# Patient Record
Sex: Female | Born: 2000 | Race: Black or African American | Hispanic: No | Marital: Single | Smoking: Former smoker
Health system: Southern US, Community
[De-identification: ages and names within clinical notes are randomized; demographics above are authoritative.]

## PROBLEM LIST (undated history)

## (undated) ENCOUNTER — Inpatient Hospital Stay (HOSPITAL_COMMUNITY): Payer: Self-pay

## (undated) DIAGNOSIS — N760 Acute vaginitis: Secondary | ICD-10-CM

## (undated) DIAGNOSIS — B9689 Other specified bacterial agents as the cause of diseases classified elsewhere: Secondary | ICD-10-CM

## (undated) DIAGNOSIS — L709 Acne, unspecified: Secondary | ICD-10-CM

## (undated) DIAGNOSIS — E559 Vitamin D deficiency, unspecified: Secondary | ICD-10-CM

## (undated) DIAGNOSIS — F419 Anxiety disorder, unspecified: Secondary | ICD-10-CM

## (undated) DIAGNOSIS — J45909 Unspecified asthma, uncomplicated: Secondary | ICD-10-CM

## (undated) HISTORY — DX: Other specified bacterial agents as the cause of diseases classified elsewhere: N76.0

## (undated) HISTORY — DX: Acne, unspecified: L70.9

## (undated) HISTORY — DX: Vitamin D deficiency, unspecified: E55.9

## (undated) HISTORY — PX: NO PAST SURGERIES: SHX2092

## (undated) HISTORY — PX: WISDOM TOOTH EXTRACTION: SHX21

## (undated) HISTORY — DX: Other specified bacterial agents as the cause of diseases classified elsewhere: B96.89

## (undated) HISTORY — DX: Anxiety disorder, unspecified: F41.9

---
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
## 2019-06-10 ENCOUNTER — Other Ambulatory Visit: Payer: Self-pay | Admitting: Family Medicine

## 2019-06-11 ENCOUNTER — Telehealth: Payer: Self-pay | Admitting: *Deleted

## 2019-06-11 NOTE — Telephone Encounter (Signed)
Benzacline not covered by Medicaid.  Please see below of formulary.  Let "RN Team" know if you are changing to covered medications or would like to pursue a PA. Jessica Fleeger, CMA    

## 2019-06-16 ENCOUNTER — Other Ambulatory Visit: Payer: Self-pay | Admitting: Family Medicine

## 2019-06-16 MED ORDER — CLINDAMYCIN PHOS-BENZOYL PEROX 1.2-5 % EX GEL: 1.0000 | Freq: Two times a day (BID) | CUTANEOUS | 0 refills | Status: DC

## 2019-06-16 NOTE — Progress Notes (Signed)
Changed benzaclin to duac per medicaid formulary  Amanda Dawn MD PGY-3 Family Medicine Resident

## 2019-06-16 NOTE — Telephone Encounter (Signed)
Changed to duac from benzaclin per medicaid formulary  Guadalupe Dawn MD PGY-3 Family Medicine Resident
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
## 2019-12-08 ENCOUNTER — Other Ambulatory Visit: Payer: Self-pay | Admitting: *Deleted

## 2019-12-09 MED ORDER — CLINDAMYCIN PHOS-BENZOYL PEROX 1.2-5 % EX GEL: 1.0000 | Freq: Two times a day (BID) | CUTANEOUS | 0 refills | Status: DC

## 2019-12-10 ENCOUNTER — Other Ambulatory Visit: Payer: Self-pay | Admitting: Family Medicine

## 2019-12-30 ENCOUNTER — Encounter (HOSPITAL_COMMUNITY): Payer: Self-pay | Admitting: Emergency Medicine

## 2019-12-30 ENCOUNTER — Emergency Department (HOSPITAL_COMMUNITY)
Admission: EM | Admit: 2019-12-30 | Discharge: 2019-12-30 | Disposition: A | Discharge status: Home / Self Care | Payer: Medicaid Other | Attending: Emergency Medicine | Admitting: Emergency Medicine

## 2019-12-30 ENCOUNTER — Other Ambulatory Visit: Payer: Self-pay

## 2019-12-30 DIAGNOSIS — J45909 Unspecified asthma, uncomplicated: Secondary | ICD-10-CM | POA: Insufficient documentation

## 2019-12-30 DIAGNOSIS — R110 Nausea: Secondary | ICD-10-CM | POA: Insufficient documentation

## 2019-12-30 DIAGNOSIS — R1012 Left upper quadrant pain: Secondary | ICD-10-CM | POA: Insufficient documentation

## 2019-12-30 LAB — CBC
HCT: 34.4 % — ABNORMAL LOW (ref 36.0–46.0)
Hemoglobin: 11.9 g/dL — ABNORMAL LOW (ref 12.0–15.0)
MCH: 27.2 pg (ref 26.0–34.0)
MCHC: 34.6 g/dL (ref 30.0–36.0)
MCV: 78.5 fL — ABNORMAL LOW (ref 80.0–100.0)
Platelets: 329 10*3/uL (ref 150–400)
RBC: 4.38 MIL/uL (ref 3.87–5.11)
RDW: 14.6 % (ref 11.5–15.5)
WBC: 10.7 10*3/uL — ABNORMAL HIGH (ref 4.0–10.5)
nRBC: 0 % (ref 0.0–0.2)

## 2019-12-30 LAB — COMPREHENSIVE METABOLIC PANEL
ALT: 10 U/L (ref 0–44)
AST: 16 U/L (ref 15–41)
Albumin: 3.9 g/dL (ref 3.5–5.0)
Alkaline Phosphatase: 59 U/L (ref 38–126)
Anion gap: 9 (ref 5–15)
BUN: 11 mg/dL (ref 6–20)
CO2: 27 mmol/L (ref 22–32)
Calcium: 9 mg/dL (ref 8.9–10.3)
Chloride: 105 mmol/L (ref 98–111)
Creatinine, Ser: 0.78 mg/dL (ref 0.44–1.00)
GFR calc Af Amer: >60 mL/min (ref >60)
GFR calc non Af Amer: >60 mL/min (ref >60)
Glucose, Bld: 77 mg/dL (ref 70–99)
Potassium: 3.3 mmol/L — ABNORMAL LOW (ref 3.5–5.1)
Sodium: 141 mmol/L (ref 135–145)
Total Bilirubin: 0.5 mg/dL (ref 0.3–1.2)
Total Protein: 7.5 g/dL (ref 6.5–8.1)

## 2019-12-30 LAB — I-STAT BETA HCG BLOOD, ED (MC, WL, AP ONLY): I-stat hCG, quantitative: <5 m[IU]/mL (ref <5)

## 2019-12-30 LAB — LIPASE, BLOOD: Lipase: 22 U/L (ref 11–51)

## 2019-12-30 MED ORDER — SUCRALFATE 1 G PO TABS
1.0000 g | ORAL_TABLET | Freq: Four times a day (QID) | ORAL | 0 refills | Status: DC | PRN
Start: 2019-12-30 — End: 2020-05-12

## 2019-12-30 MED ORDER — ALUM & MAG HYDROXIDE-SIMETH 200-200-20 MG/5ML PO SUSP
30.0000 mL | Freq: Once | ORAL | Status: AC
  Administered 2019-12-30: 30 mL via ORAL
  Filled 2019-12-30: qty 30

## 2019-12-30 MED ORDER — LIDOCAINE VISCOUS HCL 2 % MT SOLN
15.0000 mL | Freq: Once | OROMUCOSAL | Status: AC
  Administered 2019-12-30: 15:00:00 15 mL via ORAL
  Filled 2019-12-30: qty 15

## 2019-12-30 MED ORDER — SODIUM CHLORIDE 0.9% FLUSH
3.0000 mL | Freq: Once | INTRAVENOUS | Status: AC
  Administered 2019-12-30: 3 mL via INTRAVENOUS

## 2019-12-30 MED ORDER — FAMOTIDINE IN NACL 20-0.9 MG/50ML-% IV SOLN
20.0000 mg | Freq: Once | INTRAVENOUS | Status: AC
  Administered 2019-12-30: 20 mg via INTRAVENOUS
  Filled 2019-12-30: qty 50

## 2019-12-30 NOTE — ED Provider Notes (Signed)
[facility] Emergency Department Provider Note MRN:  [id number]  Arrival date & time: 12/30/19     Chief Complaint   Abdominal Pain, Nausea, and Emesis   History of Present Illness   Amanda Vincent is a 19 y.o. year-old female with a history of asthma presenting to the ED with chief complaint of abdominal pain.  Location: Left upper quadrant Duration: 1 or 2 hours Onset: Sudden about 1 hour after eating lunch Timing: Constant Description: Squeezing burning pain Severity: Mild to moderate Exacerbating/Alleviating Factors: None Associated Symptoms: Nausea Pertinent Negatives: No vomiting, no diarrhea, no chest pain, no shortness of breath, no lower abdominal pain, no dysuria, no vaginal bleeding or discharge   Review of Systems  A complete 10 system review of systems was obtained and all systems are negative except as noted in the HPI and PMH.   Patient's Health History    Past Medical History:  Diagnosis Date  . Asthma     History reviewed. No pertinent surgical history.  No family history on file.  Social History   Socioeconomic History  . Marital status: Single    Spouse name: Not on file  . Number of children: Not on file  . Years of education: Not on file  . Highest education level: Not on file  Occupational History  . Not on file  Tobacco Use  . Smoking status: Never Smoker  . Smokeless tobacco: Never Used  Substance and Sexual Activity  . Alcohol use: No  . Drug use: Not on file  . Sexual activity: Never  Other Topics Concern  . Not on file  Social History Narrative  . Not on file   Social Determinants of Health   Financial Resource Strain:   . Difficulty of Paying Living Expenses:   Food Insecurity:   . Worried About Charity fundraiser in the Last Year:   . Arboriculturist in the Last Year:   Transportation Needs:   . Film/video editor (Medical):   Marland Kitchen Lack of Transportation (Non-Medical):   Physical Activity:     . Days of Exercise per Week:   . Minutes of Exercise per Session:   Stress:   . Feeling of Stress :   Social Connections:   . Frequency of Communication with Friends and Family:   . Frequency of Social Gatherings with Friends and Family:   . Attends Religious Services:   . Active Member of Clubs or Organizations:   . Attends Archivist Meetings:   Marland Kitchen Marital Status:   Intimate Partner Violence:   . Fear of Current or Ex-Partner:   . Emotionally Abused:   Marland Kitchen Physically Abused:   . Sexually Abused:      Physical Exam   Vitals:   12/30/19 1340  BP: 117/78  Pulse: 74  Resp: 16  Temp: 98.8 F (37.1 C)  SpO2: 99%    CONSTITUTIONAL: Well-appearing, NAD NEURO:  Alert and oriented x 3, no focal deficits EYES:  eyes equal and reactive ENT/NECK:  no LAD, no JVD CARDIO: Regular rate, well-perfused, normal S1 and S2 PULM:  CTAB no wheezing or rhonchi GI/GU:  normal bowel sounds, non-distended, non-tender MSK/SPINE:  No gross deformities, no edema SKIN:  no rash, atraumatic PSYCH:  Appropriate speech and behavior  *Additional and/or pertinent findings included in MDM below  Diagnostic and Interventional Summary    EKG Interpretation  Date/Time:    Ventricular Rate:    PR Interval:    QRS  Duration:   QT Interval:    QTC Calculation:   R Axis:     Text Interpretation:        Labs Reviewed  COMPREHENSIVE METABOLIC PANEL - Abnormal; Notable for the following components:      Result Value   Potassium 3.3 (*)    All other components within normal limits  CBC - Abnormal; Notable for the following components:   WBC 10.7 (*)    Hemoglobin 11.9 (*)    HCT 34.4 (*)    MCV 78.5 (*)    All other components within normal limits  LIPASE, BLOOD  URINALYSIS, ROUTINE W REFLEX MICROSCOPIC  I-STAT BETA HCG BLOOD, ED ([facility], [facility], [facility] ONLY)    No orders to display    Medications  sodium chloride flush (NS) 0.9 % injection 3 mL (3 mLs Intravenous Given 12/30/19 1525)   famotidine (PEPCID) IVPB 20 mg premix (0 mg Intravenous Stopped 12/30/19 1630)  alum & mag hydroxide-simeth (MAALOX/MYLANTA) 200-200-20 MG/5ML suspension 30 mL (30 mLs Oral Given 12/30/19 1525)    And  lidocaine (XYLOCAINE) 2 % viscous mouth solution 15 mL (15 mLs Oral Given 12/30/19 1525)     Procedures  /  Critical Care Procedures  ED Course and Medical Decision Making  I have reviewed the triage vital signs, the nursing notes, and pertinent available records from the EMR.  Listed above are laboratory and imaging tests that I personally ordered, reviewed, and interpreted and then considered in my medical decision making (see below for details).      Suspect gastritis or acid reflux as the cause of patient's pain.  Normal vital signs, very benign abdomen, will provide GI cocktail, Pepcid, reassess.  Will evaluate labs, namely lipase.  No Murphy sign, no lower abdominal tenderness.  Patient feeling much better after medications listed above, labs reassuring, appropriate for discharge.  Elmer Sow. Pilar Plate, MD [facility] Emergency Medicine [facility] [email or web address]  Final Clinical Impressions(s) / ED Diagnoses     ICD-10-CM   1. Left upper quadrant abdominal pain  R10.12     ED Discharge Orders         Ordered    sucralfate (CARAFATE) 1 g tablet  4 times daily PRN     12/30/19 1658           Discharge Instructions Discussed with and Provided to Patient:     Discharge Instructions     You were evaluated in the Emergency Department and after careful evaluation, we did not find any emergent condition requiring admission or further testing in the hospital.  Your exam/testing today was overall reassuring.  Your symptoms seem to be due to inflammation of the lining of the stomach.  Please take the Carafate medication provided as needed for discomfort.  Please return to the Emergency Department if you experience any worsening of your condition.  We encourage  you to follow up with a primary care provider.  Thank you for allowing [place] to be a part of your care.        Sabas Sous, MD 12/30/19 1659

## 2019-12-30 NOTE — Discharge Instructions (Addendum)
You were evaluated in the Emergency Department and after careful evaluation, we did not find any emergent condition requiring admission or further testing in the hospital.  Your exam/testing today was overall reassuring.  Your symptoms seem to be due to inflammation of the lining of the stomach.  Please take the Carafate medication provided as needed for discomfort.  Please return to the Emergency Department if you experience any worsening of your condition.  We encourage you to follow up with a primary care provider.  Thank you for allowing [place] to be a part of your care.

## 2019-12-30 NOTE — ED Triage Notes (Signed)
Patient here from home with complaints of sudden onset of left upper abd pain and nausea that started 1 hour ago while eating lunch.

## 2019-12-30 NOTE — ED Notes (Signed)
An After Visit Summary was printed and given to the patient. Discharge instructions given and no further questions at this time.  

## 2020-01-15 ENCOUNTER — Encounter (HOSPITAL_COMMUNITY): Payer: Self-pay | Admitting: Emergency Medicine

## 2020-01-15 ENCOUNTER — Ambulatory Visit (HOSPITAL_COMMUNITY)
Admission: EM | Admit: 2020-01-15 | Discharge: 2020-01-15 | Disposition: A | Discharge status: Home / Self Care | Payer: Medicaid Other | Attending: Family Medicine | Admitting: Family Medicine

## 2020-01-15 ENCOUNTER — Other Ambulatory Visit: Payer: Self-pay

## 2020-01-15 DIAGNOSIS — N912 Amenorrhea, unspecified: Secondary | ICD-10-CM

## 2020-01-15 DIAGNOSIS — Z3202 Encounter for pregnancy test, result negative: Secondary | ICD-10-CM | POA: Diagnosis not present

## 2020-01-15 LAB — POC URINE PREG, ED: Preg Test, Ur: NEGATIVE

## 2020-01-15 MED ORDER — MELOXICAM 7.5 MG PO TABS: 7.5000 mg | ORAL_TABLET | Freq: Every day | ORAL | 0 refills | Status: DC

## 2020-01-15 NOTE — ED Triage Notes (Signed)
Menstrual is 3 days late, breast tenderness.

## 2020-01-15 NOTE — ED Provider Notes (Signed)
[place]   MRN: [id number] DOB: 2001/03/04  Subjective:   Amanda Vincent is a 19 y.o. female presenting for 3-day history of mild breast tenderness, intermittent fatigue and amenorrhea. LMP 12/17/2019.  Patient is worried about pregnancy given that she is very regular on her cycle.  Denies concern for STI in prefers to hold off on any testing for this for now.  No current facility-administered medications for this encounter.  Current Outpatient Medications:  .  sucralfate (CARAFATE) 1 g tablet, Take 1 tablet (1 g total) by mouth 4 (four) times daily as needed., Disp: 30 tablet, Rfl: 0 .  Clindamycin-Benzoyl Per, Refr, gel, APPLY 1 EACH TOPICALLY 2 (TWO) TIMES DAILY., Disp: 45 g, Rfl: 0 .  ibuprofen (ADVIL,MOTRIN) 600 MG tablet, Take 1 tablet (600 mg total) by mouth every 6 (six) hours as needed., Disp: 30 tablet, Rfl: 0   Allergies  Allergen Reactions  . Corn-Containing Products   . Dog Epithelium   . Mold Extract [Trichophyton]   . Peanut-Containing Drug Products   . Sesame Seed (Diagnostic)     Past Medical History:  Diagnosis Date  . Asthma      History reviewed. No pertinent surgical history.  No family history on file.  Social History   Tobacco Use  . Smoking status: Never Smoker  . Smokeless tobacco: Never Used  Substance Use Topics  . Alcohol use: No  . Drug use: Not on file    ROS   Objective:   Vitals: BP 118/76   Pulse 79   Temp 98.5 F (36.9 C) (Oral)   Resp 16   LMP 12/17/2019   SpO2 98%   Physical Exam Constitutional:      General: She is not in acute distress.    Appearance: Normal appearance. She is well-developed. She is not ill-appearing, toxic-appearing or diaphoretic.  HENT:     Head: Normocephalic and atraumatic.     Nose: Nose normal.     Mouth/Throat:     Mouth: Mucous membranes are moist.  Eyes:     Extraocular Movements: Extraocular movements intact.     Pupils: Pupils are equal, round, and reactive to  light.  Cardiovascular:     Rate and Rhythm: Normal rate and regular rhythm.     Pulses: Normal pulses.     Heart sounds: Normal heart sounds. No murmur. No friction rub. No gallop.   Pulmonary:     Effort: Pulmonary effort is normal. No respiratory distress.     Breath sounds: Normal breath sounds. No stridor. No wheezing, rhonchi or rales.  Skin:    General: Skin is warm and dry.     Findings: No rash.  Neurological:     Mental Status: She is alert and oriented to person, place, and time.  Psychiatric:        Mood and Affect: Mood normal.        Behavior: Behavior normal.        Thought Content: Thought content normal.        Judgment: Judgment normal.     Results for orders placed or performed during the hospital encounter of 01/15/20 (from the past 24 hour(s))  POC urine pregnancy     Status: None   Collection Time: 01/15/20  5:04 PM  Result Value Ref Range   Preg Test, Ur NEGATIVE NEGATIVE    Assessment and Plan :   PDMP not reviewed this encounter.  1. Amenorrhea   2. Negative pregnancy test  Counseled on amenorrhea, recommended supportive care. Follow up with gynecologist if sx persist. Counseled patient on potential for adverse effects with medications prescribed/recommended today, ER and return-to-clinic precautions discussed, patient verbalized understanding.    Wallis Bamberg, [place] 01/15/20 1707

## 2020-02-05 ENCOUNTER — Other Ambulatory Visit: Payer: Self-pay | Admitting: Family Medicine

## 2020-03-16 ENCOUNTER — Other Ambulatory Visit: Payer: Self-pay | Admitting: Family Medicine

## 2020-03-30 DIAGNOSIS — Z3009 Encounter for other general counseling and advice on contraception: Secondary | ICD-10-CM | POA: Diagnosis not present

## 2020-03-30 DIAGNOSIS — Z3200 Encounter for pregnancy test, result unknown: Secondary | ICD-10-CM | POA: Diagnosis not present

## 2020-04-12 ENCOUNTER — Emergency Department (HOSPITAL_COMMUNITY)
Admission: EM | Admit: 2020-04-12 | Discharge: 2020-04-13 | Disposition: A | Discharge status: Home / Self Care | Payer: Medicaid Other | Attending: Emergency Medicine | Admitting: Emergency Medicine

## 2020-04-12 ENCOUNTER — Encounter (HOSPITAL_COMMUNITY): Payer: Self-pay | Admitting: Emergency Medicine

## 2020-04-12 ENCOUNTER — Other Ambulatory Visit: Payer: Self-pay | Admitting: Student in an Organized Health Care Education/Training Program

## 2020-04-12 ENCOUNTER — Other Ambulatory Visit: Payer: Self-pay

## 2020-04-12 DIAGNOSIS — O208 Other hemorrhage in early pregnancy: Secondary | ICD-10-CM | POA: Diagnosis present

## 2020-04-12 DIAGNOSIS — Z5321 Procedure and treatment not carried out due to patient leaving prior to being seen by health care provider: Secondary | ICD-10-CM | POA: Diagnosis not present

## 2020-04-12 DIAGNOSIS — Z3A12 12 weeks gestation of pregnancy: Secondary | ICD-10-CM | POA: Diagnosis not present

## 2020-04-12 NOTE — ED Triage Notes (Addendum)
Pt reports positive pregnancy test with EDD 10/21/20. Reports passing a large brown clot this morning. Here wanting confirmation of viable pregnacy. VSS. NAD at present.

## 2020-04-13 NOTE — ED Notes (Signed)
Pt did not respond when called for vitals check and is not seen in or outside the lobby

## 2020-04-21 ENCOUNTER — Encounter: Payer: Self-pay | Admitting: General Practice

## 2020-04-29 ENCOUNTER — Other Ambulatory Visit: Payer: Self-pay

## 2020-04-29 DIAGNOSIS — Z3401 Encounter for supervision of normal first pregnancy, first trimester: Secondary | ICD-10-CM | POA: Diagnosis not present

## 2020-04-29 DIAGNOSIS — J45909 Unspecified asthma, uncomplicated: Secondary | ICD-10-CM | POA: Diagnosis not present

## 2020-04-29 DIAGNOSIS — F419 Anxiety disorder, unspecified: Secondary | ICD-10-CM | POA: Diagnosis not present

## 2020-04-29 LAB — CYSTIC FIBROSIS DIAGNOSTIC STUDY: Interpretation-CFDNA:: NEGATIVE

## 2020-04-29 LAB — SICKLE CELL SCREEN

## 2020-05-10 ENCOUNTER — Encounter: Payer: Self-pay | Admitting: *Deleted

## 2020-05-12 ENCOUNTER — Other Ambulatory Visit (HOSPITAL_COMMUNITY): Payer: Self-pay | Admitting: Nurse Practitioner

## 2020-05-12 ENCOUNTER — Ambulatory Visit: Payer: Medicaid Other

## 2020-05-12 ENCOUNTER — Ambulatory Visit: Payer: Medicaid Other | Admitting: *Deleted

## 2020-05-12 ENCOUNTER — Encounter: Payer: Self-pay | Admitting: *Deleted

## 2020-05-12 ENCOUNTER — Other Ambulatory Visit: Payer: Self-pay

## 2020-05-12 ENCOUNTER — Ambulatory Visit: Payer: Medicaid Other | Attending: Obstetrics and Gynecology

## 2020-05-12 VITALS — BP 125/88 | HR 82 | Wt 144.6 lb

## 2020-05-12 DIAGNOSIS — Z369 Encounter for antenatal screening, unspecified: Secondary | ICD-10-CM

## 2020-05-12 DIAGNOSIS — Z3682 Encounter for antenatal screening for nuchal translucency: Secondary | ICD-10-CM

## 2020-05-14 ENCOUNTER — Telehealth: Payer: Self-pay | Admitting: Obstetrics and Gynecology

## 2020-05-14 LAB — FIRST TRIMESTER SCREEN W/NT
CRL: 75.8 mm
DIA MoM: 2.67
DIA Value: 540.6 pg/mL
Gest Age-Collect: 13.4 weeks
Maternal Age At EDD: 21 yr
Nuchal Translucency MoM: 0.8
Nuchal Translucency: 1.3 mm
Number of Fetuses: 1
PAPP-A MoM: 2.99
PAPP-A Value: 4342.8 ng/mL
Test Results:: NEGATIVE
Weight: 145 [lb_av]
hCG MoM: 1.68
hCG Value: 136.6 IU/mL

## 2020-05-14 NOTE — Telephone Encounter (Signed)
I called [name] to review that she has negative first trimester screen results that decreased the risk for her pregnancy to be affected by Down syndrome from her 1 in 12,029 age-related risk to 1 in 6,700. The risk for the pregnancy to be affected by trisomy 18 also decreased from her 1 in 2,014 age-related risk to 1 in <10,000. [name] was reminded that while this screen significantly reduced the likelihood of the pregnancy being affected by trisomy 19 or trisomy 73, it cannot be considered diagnostic.   We also reviewed that AFP screening for open neural tube defects is available in the second trimester if desired.  Cherly Anderson, MS, CGC

## 2020-05-27 ENCOUNTER — Other Ambulatory Visit: Payer: Self-pay | Admitting: Family

## 2020-05-27 DIAGNOSIS — Z789 Other specified health status: Secondary | ICD-10-CM | POA: Diagnosis not present

## 2020-05-27 DIAGNOSIS — O99330 Smoking (tobacco) complicating pregnancy, unspecified trimester: Secondary | ICD-10-CM | POA: Diagnosis not present

## 2020-05-27 DIAGNOSIS — N83202 Unspecified ovarian cyst, left side: Secondary | ICD-10-CM | POA: Diagnosis not present

## 2020-05-27 DIAGNOSIS — Z3A15 15 weeks gestation of pregnancy: Secondary | ICD-10-CM | POA: Diagnosis not present

## 2020-05-27 DIAGNOSIS — Z362 Encounter for other antenatal screening follow-up: Secondary | ICD-10-CM

## 2020-05-27 DIAGNOSIS — O281 Abnormal biochemical finding on antenatal screening of mother: Secondary | ICD-10-CM | POA: Diagnosis not present

## 2020-05-27 DIAGNOSIS — J45909 Unspecified asthma, uncomplicated: Secondary | ICD-10-CM | POA: Diagnosis not present

## 2020-05-27 DIAGNOSIS — F419 Anxiety disorder, unspecified: Secondary | ICD-10-CM | POA: Diagnosis not present

## 2020-05-27 DIAGNOSIS — O99320 Drug use complicating pregnancy, unspecified trimester: Secondary | ICD-10-CM | POA: Diagnosis not present

## 2020-05-27 DIAGNOSIS — R824 Acetonuria: Secondary | ICD-10-CM | POA: Diagnosis not present

## 2020-05-27 DIAGNOSIS — Z3402 Encounter for supervision of normal first pregnancy, second trimester: Secondary | ICD-10-CM | POA: Diagnosis not present

## 2020-05-27 DIAGNOSIS — D582 Other hemoglobinopathies: Secondary | ICD-10-CM | POA: Diagnosis not present

## 2020-06-07 ENCOUNTER — Other Ambulatory Visit: Payer: Self-pay | Admitting: Obstetrics & Gynecology

## 2020-06-17 ENCOUNTER — Inpatient Hospital Stay (HOSPITAL_COMMUNITY)
Admission: AD | Admit: 2020-06-17 | Discharge: 2020-06-17 | Disposition: A | Discharge status: Home / Self Care | Payer: Medicaid Other | Attending: Obstetrics & Gynecology | Admitting: Obstetrics & Gynecology

## 2020-06-17 ENCOUNTER — Other Ambulatory Visit: Payer: Self-pay

## 2020-06-17 ENCOUNTER — Inpatient Hospital Stay (HOSPITAL_BASED_OUTPATIENT_CLINIC_OR_DEPARTMENT_OTHER): Payer: Medicaid Other

## 2020-06-17 ENCOUNTER — Encounter (HOSPITAL_COMMUNITY): Payer: Self-pay | Admitting: Obstetrics & Gynecology

## 2020-06-17 DIAGNOSIS — O4102X0 Oligohydramnios, second trimester, not applicable or unspecified: Secondary | ICD-10-CM

## 2020-06-17 DIAGNOSIS — O99512 Diseases of the respiratory system complicating pregnancy, second trimester: Secondary | ICD-10-CM | POA: Insufficient documentation

## 2020-06-17 DIAGNOSIS — O42912 Preterm premature rupture of membranes, unspecified as to length of time between rupture and onset of labor, second trimester: Secondary | ICD-10-CM | POA: Insufficient documentation

## 2020-06-17 DIAGNOSIS — O42919 Preterm premature rupture of membranes, unspecified as to length of time between rupture and onset of labor, unspecified trimester: Secondary | ICD-10-CM

## 2020-06-17 DIAGNOSIS — Z3A18 18 weeks gestation of pregnancy: Secondary | ICD-10-CM | POA: Insufficient documentation

## 2020-06-17 DIAGNOSIS — O4100X0 Oligohydramnios, unspecified trimester, not applicable or unspecified: Secondary | ICD-10-CM

## 2020-06-17 LAB — URINALYSIS, ROUTINE W REFLEX MICROSCOPIC
Bacteria, UA: NONE SEEN
Bilirubin Urine: NEGATIVE
Glucose, UA: NEGATIVE mg/dL
Hgb urine dipstick: NEGATIVE
Ketones, ur: 20 mg/dL — AB
Leukocytes,Ua: NEGATIVE
Nitrite: NEGATIVE
Protein, ur: 30 mg/dL — AB
Specific Gravity, Urine: 1.027 (ref 1.005–1.030)
pH: 6 (ref 5.0–8.0)

## 2020-06-17 LAB — WET PREP, GENITAL
Clue Cells Wet Prep HPF POC: NONE SEEN
Sperm: NONE SEEN
Trich, Wet Prep: NONE SEEN
WBC, Wet Prep HPF POC: NONE SEEN
Yeast Wet Prep HPF POC: NONE SEEN

## 2020-06-17 LAB — POCT FERN TEST: POCT Fern Test: NEGATIVE

## 2020-06-17 LAB — AMNISURE RUPTURE OF MEMBRANE (ROM) NOT AT ARMC: Amnisure ROM: POSITIVE

## 2020-06-17 NOTE — MAU Provider Note (Signed)
History     CSN: [id number]  Arrival date and time: 06/17/20 5170   First Provider Initiated Contact with Patient 06/17/20 1013      Chief Complaint  Patient presents with  . Rupture of Membranes   HPI   Ms.Shakisha Ruff-Biggs is a 19 y.o. female here with leaking of fluid. She noticed leaking/gush of fluid while in the shower, reports it going down her leg and not being able to stop it. Noticed some mucus, no bleeding. No pain  Receiving care at the [facility]. Low risk pregnancy.    OB History    Gravida  1   Para      Term      Preterm      AB      Living  0     SAB      TAB      Ectopic      Multiple      Live Births              Past Medical History:  Diagnosis Date  . Acne   . Anxiety   . Asthma     Past Surgical History:  Procedure Laterality Date  . NO PAST SURGERIES      History reviewed. No pertinent family history.  Social History   Tobacco Use  . Smoking status: Never Smoker  . Smokeless tobacco: Never Used  Vaping Use  . Vaping Use: Never used  Substance Use Topics  . Alcohol use: Not Currently  . Drug use: Not Currently    Types: Marijuana    Allergies:  Allergies  Allergen Reactions  . Corn-Containing Products   . Dog Epithelium   . Mold Extract [Trichophyton]   . Peanut-Containing Drug Products   . Sesame Seed (Diagnostic)     Medications Prior to Admission  Medication Sig Dispense Refill Last Dose  . cetirizine (ZYRTEC) 10 MG tablet Take 10 mg by mouth daily.   Past Week at Unknown time  . Prenatal Vit-Fe Fumarate-FA (M-NATAL PLUS) 27-1 MG TABS TAKE 1 TABLET BY MOUTH EVERY DAY 30 tablet 12 06/16/2020 at Unknown time  . acetaminophen (TYLENOL) 325 MG tablet Take 650 mg by mouth every 6 (six) hours as needed.     . Prenatal MV & Min w/FA-DHA (PRENATAL ADULT GUMMY/DHA/FA PO) Take by mouth.      Results for orders placed or performed during the hospital encounter of 06/17/20 (from the past 48 hour(s))  Urinalysis,  Routine w reflex microscopic Urine, Clean Catch     Status: Abnormal   Collection Time: 06/17/20 10:10 AM  Result Value Ref Range   Color, Urine AMBER (A) YELLOW    Comment: BIOCHEMICALS MAY BE AFFECTED BY COLOR   APPearance HAZY (A) CLEAR   Specific Gravity, Urine 1.027 1.005 - 1.030   pH 6.0 5.0 - 8.0   Glucose, UA NEGATIVE NEGATIVE mg/dL   Hgb urine dipstick NEGATIVE NEGATIVE   Bilirubin Urine NEGATIVE NEGATIVE   Ketones, ur 20 (A) NEGATIVE mg/dL   Protein, ur 30 (A) NEGATIVE mg/dL   Nitrite NEGATIVE NEGATIVE   Leukocytes,Ua NEGATIVE NEGATIVE   RBC / HPF 0-5 0 - 5 RBC/hpf   WBC, UA 0-5 0 - 5 WBC/hpf   Bacteria, UA NONE SEEN NONE SEEN   Squamous Epithelial / LPF 0-5 0 - 5   Mucus PRESENT     Comment: Performed at [facility], [address], [place], [place] [address]  Amnisure rupture of membrane (  rom)not at [facility]     Status: None   Collection Time: 06/17/20 10:35 AM  Result Value Ref Range   Amnisure ROM POSITIVE     Comment: Performed at [facility], [address] [place], [place] [address]  Wet prep, genital     Status: None   Collection Time: 06/17/20 10:35 AM  Result Value Ref Range   Yeast Wet Prep HPF POC NONE SEEN NONE SEEN   Trich, Wet Prep NONE SEEN NONE SEEN   Clue Cells Wet Prep HPF POC NONE SEEN NONE SEEN   WBC, Wet Prep HPF POC NONE SEEN NONE SEEN   Sperm NONE SEEN     Comment: Performed at [facility], [address] [place], [place] [address]  POCT fern test     Status: None   Collection Time: 06/17/20 10:42 AM  Result Value Ref Range   POCT Fern Test Negative = intact amniotic membranes    US MFM OB LIMITED  Result Date: 06/17/2020 ----------------------------------------------------------------------  OBSTETRICS REPORT                       (Signed Final 06/17/2020 02:00 pm) ---------------------------------------------------------------------- Patient Info  ID #:       [id number]                          D.O.B.:  20-Jan-2001 (19 yrs)   Name:       Cecile SheererKENNEIA RUFF-BIGGS              Visit Date: 06/17/2020 10:33 am ---------------------------------------------------------------------- Performed By  Attending:        Noralee Spaceavi Shankar MD        Ref. Address:     [address]                                                             [address]                                                             [place], [place]                                                             [id number]  Performed By:     Sandi MealyJovancia Adrien        [facility]  Referred By:      [facility]                    Faculty Physician ---------------------------------------------------------------------- Orders  #  Description                           Code        Ordered By  1  [place] MFM OB LIMITED                     [phone]    Noris Kulinski [facility] ----------------------------------------------------------------------  #  Order #                     Accession #                Episode #  1  [id number]                   [id number]                 [id number] ---------------------------------------------------------------------- Indications  [redacted] weeks gestation of pregnancy                Z3A.18  Premature rupture of membranes - leaking       O42.90  fluid ---------------------------------------------------------------------- Fetal Evaluation  Num Of Fetuses:         1  Fetal Heart Rate(bpm):  155  Cardiac Activity:       Observed  Presentation:           Cephalic  Placenta:               Posterior  P. Cord Insertion:      Not well visualized  Amniotic Fluid  AFI FV:      Anhydramnios                              Largest Pocket(cm)                              0 ---------------------------------------------------------------------- OB History  Gravidity:    1         Term:   0        Prem:   0        SAB:   0  TOP:          0       Ectopic:  0        Living: 0  ---------------------------------------------------------------------- Gestational Age  LMP:           18w 1d        Date:  02/11/20                 EDD:   11/17/20  Best:          18w 1d     Det. By:  LMP  (02/11/20)          EDD:   11/17/20 ---------------------------------------------------------------------- Cervix Uterus Adnexa  Cervix  Length:            2.9  cm.  Closed ---------------------------------------------------------------------- Impression  Patient was evaluated at the MAU for c/o leakage of amniotic  fluid.  A limited ultrasound study was performed. Anhydramnios is  seen. Cephalic presentation. Good fetal heart activity is  present.  Patient was counseled by her Ob providers and she will be  transferring her care to [facility] from [facility]. ---------------------------------------------------------------------- Recommendations  Patient has an appointment on 06/24/20 for fetal anatomy  scan. ----------------------------------------------------------------------                  Noralee Space, MD Electronically Signed Final Report   06/17/2020 02:00 pm ----------------------------------------------------------------------  Review of Systems  Constitutional: Negative for fever.  Gastrointestinal: Negative for abdominal pain.  Genitourinary: Positive for vaginal discharge. Negative for vaginal bleeding.   Physical Exam   Blood pressure 120/71, pulse 83, temperature 98.4 F (36.9 C), temperature source Oral, resp. rate 18, height 5' 2.5" (1.588 m), weight 63.1 kg, last menstrual period 02/11/2020, SpO2 100 %.  Physical Exam Constitutional:      Appearance: Normal appearance. She is normal weight.  Abdominal:     Palpations: Abdomen is soft.     Tenderness: There is no abdominal tenderness.  Genitourinary:    Comments: Vagina - large amount of clear/mucoid vaginal discharge, no odor  Cervix - No contact bleeding, no active bleeding. Visually closed.  Bimanual exam: deferred  GC/Chlam, wet prep  done Chaperone present for exam.  Neurological:     Mental Status: She is alert.     MAU Course  Procedures   MDM  Amnisure positive Positive pooling or fluid US shows anhydramnio's  Discussed findings with Patient. Dr. Debroah Loop down to MAU to discuss plan of care/what to expect going forward.  She is scheduled her MFM [place] next week and has an appt with the HD . She was encouraged to keep these appointments. A high priority message was sent to the Med Center to schedule patient for a New OB appointment/transfer of care.   Assessment and Plan   A:  1. Antepartum anhydramnios, single or unspecified fetus   2. [redacted] weeks gestation of pregnancy   3. Preterm premature rupture of membranes (PPROM) with unknown onset of labor     P:  Discharge home with strict return precautions Return to MAU if symptoms worsen Follow up next week as scheduled Med Center to call patient to schedule appointment Support given  Duane Lope, NP 06/17/2020 6:15 PM

## 2020-06-17 NOTE — MAU Note (Signed)
Presents with c/o LOF this morning.  States had a gush of fluid while showering but fluid continued to leak after showering.  States fluid is yellow.  Denies VB.

## 2020-06-17 NOTE — Discharge Instructions (Signed)
Premature Rupture and Preterm Premature Rupture of Membranes  A sac made up of membranes surrounds your baby in the womb (uterus). Rupture of membranes is when this sac breaks open. This is also known as your "water breaking." When this sac breaks before labor starts, it is called premature rupture of membranes (PROM). If this happens before 37 weeks of being pregnant, it is called preterm premature rupture of membranes (PPROM). PPROM is serious. It needs medical care right away. What increases the risk of PPROM? PPROM is more likely to happen in women who:  Have an infection.  Have had PPROM before.  Have a cervix that is short.  Have bleeding during the second or third trimester.  Have a low BMI. This is a measure of body fat.  Smoke.  Use drugs.  Have a low socioeconomic status. What problems can be caused by PROM and PPROM? This condition creates health dangers for the mother and the baby. These include:  Giving birth to the baby too early (prematurely).  Getting a serious infection of the placenta (chorioamnionitis).  Having the placenta detach from the uterus early (placental abruption).  Squeezing of the umbilical cord.  Getting a serious infection after delivery. What are the signs of PROM and PPROM?  A sudden gush of fluid from the vagina.  A slow leak of fluid from the vagina.  Your underwear is wet. What should I do if I think my water broke? Call your doctor right away. You will need to go to the hospital to get checked right away. What happens if I am told that I have PROM or PPROM? You will have tests done at the hospital.  If you have PROM, you may be given medicine to start labor (be induced). This may be done if you are not having contractions during the 24 hours after your water broke.  If you have PPROM and are not having contractions, you may be given medicine to start labor. It will depend on how far along you are in your pregnancy. If you have  PPROM:  You and your baby will be watched closely to see if you have infections or other problems.  You may be given: ? An antibiotic medicine. This can stop an infection from starting. ? A steroid medicine. This can help your baby's lungs develop faster. ? A medicine to help prevent cerebral palsy in your baby. ? A medicine to stop early labor (preterm labor).  You may be told to stay in bed except to use the bathroom (bed rest).  You may be given medicine to start labor. This may be done if there are problems with you or the baby. Your treatment will depend on many factors. Contact a doctor if:  Your water breaks and you are not having contractions. Get help right away if:  Your water breaks before you are [redacted] weeks pregnant. Summary  When your water breaks before labor starts, it is called premature rupture of membranes (PROM).  When PROM happens before 37 weeks of pregnancy, it is called preterm premature rupture of membranes (PPROM).  If you are not having contractions, your labor may be started for you. This information is not intended to replace advice given to you by your health care provider. Make sure you discuss any questions you have with your health care provider. Document Revised: 11/29/2018 Document Reviewed: 04/27/2016 Elsevier Patient Education  2020 [employer].

## 2020-06-18 LAB — GC/CHLAMYDIA PROBE AMP (~~LOC~~) NOT AT ARMC
Chlamydia: NEGATIVE
Comment: NEGATIVE
Comment: NORMAL
Neisseria Gonorrhea: NEGATIVE

## 2020-06-21 ENCOUNTER — Other Ambulatory Visit: Payer: Self-pay

## 2020-06-21 ENCOUNTER — Encounter (HOSPITAL_COMMUNITY): Payer: Self-pay | Admitting: Obstetrics and Gynecology

## 2020-06-21 ENCOUNTER — Inpatient Hospital Stay (HOSPITAL_BASED_OUTPATIENT_CLINIC_OR_DEPARTMENT_OTHER): Payer: Medicaid Other

## 2020-06-21 ENCOUNTER — Observation Stay (HOSPITAL_COMMUNITY)
Admission: AD | Admit: 2020-06-21 | Discharge: 2020-06-22 | Disposition: A | Discharge status: Home / Self Care | Payer: Medicaid Other | Attending: Family Medicine | Admitting: Family Medicine

## 2020-06-21 DIAGNOSIS — Z20822 Contact with and (suspected) exposure to covid-19: Secondary | ICD-10-CM | POA: Diagnosis not present

## 2020-06-21 DIAGNOSIS — O42912 Preterm premature rupture of membranes, unspecified as to length of time between rupture and onset of labor, second trimester: Secondary | ICD-10-CM

## 2020-06-21 DIAGNOSIS — Z3A18 18 weeks gestation of pregnancy: Secondary | ICD-10-CM

## 2020-06-21 DIAGNOSIS — O4102X0 Oligohydramnios, second trimester, not applicable or unspecified: Secondary | ICD-10-CM

## 2020-06-21 DIAGNOSIS — O411220 Chorioamnionitis, second trimester, not applicable or unspecified: Secondary | ICD-10-CM | POA: Diagnosis present

## 2020-06-21 DIAGNOSIS — O42919 Preterm premature rupture of membranes, unspecified as to length of time between rupture and onset of labor, unspecified trimester: Principal | ICD-10-CM | POA: Diagnosis present

## 2020-06-21 DIAGNOSIS — O42012 Preterm premature rupture of membranes, onset of labor within 24 hours of rupture, second trimester: Secondary | ICD-10-CM

## 2020-06-21 DIAGNOSIS — O26892 Other specified pregnancy related conditions, second trimester: Secondary | ICD-10-CM

## 2020-06-21 DIAGNOSIS — J45909 Unspecified asthma, uncomplicated: Secondary | ICD-10-CM | POA: Diagnosis not present

## 2020-06-21 DIAGNOSIS — O4692 Antepartum hemorrhage, unspecified, second trimester: Secondary | ICD-10-CM

## 2020-06-21 DIAGNOSIS — N939 Abnormal uterine and vaginal bleeding, unspecified: Secondary | ICD-10-CM

## 2020-06-21 DIAGNOSIS — R109 Unspecified abdominal pain: Secondary | ICD-10-CM | POA: Diagnosis not present

## 2020-06-21 LAB — URINALYSIS, ROUTINE W REFLEX MICROSCOPIC
Bacteria, UA: NONE SEEN
Bilirubin Urine: NEGATIVE
Glucose, UA: NEGATIVE mg/dL
Ketones, ur: NEGATIVE mg/dL
Nitrite: NEGATIVE
Protein, ur: NEGATIVE mg/dL
Specific Gravity, Urine: 1.019 (ref 1.005–1.030)
pH: 7 (ref 5.0–8.0)

## 2020-06-21 LAB — WET PREP, GENITAL
Clue Cells Wet Prep HPF POC: NONE SEEN
Sperm: NONE SEEN
Trich, Wet Prep: NONE SEEN
Yeast Wet Prep HPF POC: NONE SEEN

## 2020-06-21 LAB — CBC
HCT: 34.8 % — ABNORMAL LOW (ref 36.0–46.0)
Hemoglobin: 12.5 g/dL (ref 12.0–15.0)
MCH: 30.1 pg (ref 26.0–34.0)
MCHC: 35.9 g/dL (ref 30.0–36.0)
MCV: 83.9 fL (ref 80.0–100.0)
Platelets: 217 10*3/uL (ref 150–400)
RBC: 4.15 MIL/uL (ref 3.87–5.11)
RDW: 13.4 % (ref 11.5–15.5)
WBC: 14.4 10*3/uL — ABNORMAL HIGH (ref 4.0–10.5)
nRBC: 0 % (ref 0.0–0.2)

## 2020-06-21 LAB — RESPIRATORY PANEL BY RT PCR (FLU A&B, COVID)
Influenza A by PCR: NEGATIVE
Influenza B by PCR: NEGATIVE
SARS Coronavirus 2 by RT PCR: NEGATIVE

## 2020-06-21 LAB — TYPE AND SCREEN
ABO/RH(D): O POS
Antibody Screen: NEGATIVE

## 2020-06-21 MED ORDER — LACTATED RINGERS IV SOLN: INTRAVENOUS | Status: DC

## 2020-06-21 MED ORDER — CALCIUM CARBONATE ANTACID 500 MG PO CHEW: 2.0000 | CHEWABLE_TABLET | ORAL | Status: DC | PRN

## 2020-06-21 MED ORDER — DOCUSATE SODIUM 100 MG PO CAPS: 100.0000 mg | ORAL_CAPSULE | Freq: Every day | ORAL | Status: DC

## 2020-06-21 MED ORDER — ACETAMINOPHEN 325 MG PO TABS
650.0000 mg | ORAL_TABLET | ORAL | Status: DC | PRN
  Administered 2020-06-21: 650 mg via ORAL
  Filled 2020-06-21: qty 2

## 2020-06-21 MED ORDER — FENTANYL CITRATE (PF) 100 MCG/2ML IJ SOLN
100.0000 ug | INTRAMUSCULAR | Status: DC | PRN
  Administered 2020-06-21 (×2): 100 ug via INTRAVENOUS
  Filled 2020-06-21 (×2): qty 2

## 2020-06-21 MED ORDER — HYDROMORPHONE HCL 1 MG/ML IJ SOLN
1.0000 mg | INTRAMUSCULAR | Status: DC | PRN
  Administered 2020-06-21: 1 mg via INTRAVENOUS
  Filled 2020-06-21: qty 1

## 2020-06-21 MED ORDER — ZOLPIDEM TARTRATE 5 MG PO TABS: 5.0000 mg | ORAL_TABLET | Freq: Every evening | ORAL | Status: DC | PRN

## 2020-06-21 MED ORDER — LACTATED RINGERS IV BOLUS: 1000.0000 mL | Freq: Once | INTRAVENOUS | Status: DC

## 2020-06-21 MED ORDER — OXYTOCIN-SODIUM CHLORIDE 30-0.9 UT/500ML-% IV SOLN
INTRAVENOUS | Status: AC
  Filled 2020-06-21: qty 500

## 2020-06-21 MED ORDER — PRENATAL MULTIVITAMIN CH
1.0000 | ORAL_TABLET | Freq: Every day | ORAL | Status: DC
  Administered 2020-06-22: 1 via ORAL
  Filled 2020-06-21: qty 1

## 2020-06-21 MED ORDER — LACTATED RINGERS IV BOLUS
1000.0000 mL | Freq: Once | INTRAVENOUS | Status: AC
  Administered 2020-06-21: 1000 mL via INTRAVENOUS

## 2020-06-21 MED ORDER — IBUPROFEN 600 MG PO TABS
600.0000 mg | ORAL_TABLET | Freq: Four times a day (QID) | ORAL | Status: DC
  Administered 2020-06-21 – 2020-06-22 (×4): 600 mg via ORAL
  Filled 2020-06-21 (×4): qty 1

## 2020-06-21 NOTE — MAU Provider Note (Addendum)
History     CSN: [id number]  Arrival date and time: 06/21/20 0254   First Provider Initiated Contact with Patient 06/21/20 [phone]      Chief Complaint  Patient presents with  . Vaginal Bleeding  . Abdominal Pain   HPI This is a 19 year old G1, P0 at 18 weeks and 5 days.  She had been receiving care with the health department.  She presented to the MAU on 10/28 and was diagnosed with previable PPROM.  She was discharged home management.  Was given strict return precautions.  Last night and this morning, she had experienced a couple episodes of vaginal bleeding when she stopped using the bathroom.  In addition, she started having some mild cramping this morning.  Denies fevers, chills, nausea, vomiting.   OB History    Gravida  1   Para      Term      Preterm      AB      Living  0     SAB      TAB      Ectopic      Multiple      Live Births              Past Medical History:  Diagnosis Date  . Acne   . Anxiety   . Asthma     Past Surgical History:  Procedure Laterality Date  . NO PAST SURGERIES      History reviewed. No pertinent family history.  Social History   Tobacco Use  . Smoking status: Never Smoker  . Smokeless tobacco: Never Used  Vaping Use  . Vaping Use: Never used  Substance Use Topics  . Alcohol use: Not Currently  . Drug use: Not Currently    Types: Marijuana    Allergies:  Allergies  Allergen Reactions  . Corn-Containing Products   . Dog Epithelium   . Mold Extract [Trichophyton]   . Peanut-Containing Drug Products   . Sesame Seed (Diagnostic)     Medications Prior to Admission  Medication Sig Dispense Refill Last Dose  . acetaminophen (TYLENOL) 325 MG tablet Take 650 mg by mouth every 6 (six) hours as needed.   Past Week at Unknown time  . cetirizine (ZYRTEC) 10 MG tablet Take 10 mg by mouth daily.   06/21/2020 at Unknown time  . Prenatal Vit-Fe Fumarate-FA (M-NATAL PLUS) 27-1 MG TABS TAKE 1 TABLET BY MOUTH EVERY DAY  30 tablet 12 06/21/2020 at Unknown time  . Prenatal MV & Min w/FA-DHA (PRENATAL ADULT GUMMY/DHA/FA PO) Take by mouth.       Review of Systems Physical Exam   Blood pressure 122/67, pulse 97, temperature 98.7 F (37.1 C), temperature source Oral, resp. rate 18, height 5' 2.5" (1.588 m), weight 63.8 kg, last menstrual period 02/11/2020, SpO2 100 %.  Physical Exam Vitals reviewed. Exam conducted with a chaperone present.  Constitutional:      Appearance: She is well-developed.  Abdominal:     General: Bowel sounds are normal.     Tenderness: There is no abdominal tenderness. There is no right CVA tenderness, left CVA tenderness, guarding or rebound.  Genitourinary:    Vagina: No signs of injury and foreign body.     Comments: Pooling of clear fluid. No bleeding on speculum exam. Skin:    Capillary Refill: Capillary refill takes less than 2 seconds.  Neurological:     Mental Status: She is alert.    Dilation: 1 Effacement (%): 50 Station: -  3 Exam by:: Mansfield Dann, DO   Results for orders placed or performed during the hospital encounter of 06/21/20 (from the past 24 hour(s))  Urinalysis, Routine w reflex microscopic Urine, Clean Catch     Status: Abnormal   Collection Time: 06/21/20  9:12 AM  Result Value Ref Range   Color, Urine YELLOW YELLOW   APPearance HAZY (A) CLEAR   Specific Gravity, Urine 1.019 1.005 - 1.030   pH 7.0 5.0 - 8.0   Glucose, UA NEGATIVE NEGATIVE mg/dL   Hgb urine dipstick SMALL (A) NEGATIVE   Bilirubin Urine NEGATIVE NEGATIVE   Ketones, ur NEGATIVE NEGATIVE mg/dL   Protein, ur NEGATIVE NEGATIVE mg/dL   Nitrite NEGATIVE NEGATIVE   Leukocytes,Ua SMALL (A) NEGATIVE   RBC / HPF 0-5 0 - 5 RBC/hpf   WBC, UA 11-20 0 - 5 WBC/hpf   Bacteria, UA NONE SEEN NONE SEEN   Squamous Epithelial / LPF 0-5 0 - 5   Mucus PRESENT   Wet prep, genital     Status: Abnormal   Collection Time: 06/21/20  9:20 AM  Result Value Ref Range   Yeast Wet Prep HPF POC NONE SEEN NONE  SEEN   Trich, Wet Prep NONE SEEN NONE SEEN   Clue Cells Wet Prep HPF POC NONE SEEN NONE SEEN   WBC, Wet Prep HPF POC MANY (A) NONE SEEN   Sperm NONE SEEN   CBC     Status: Abnormal   Collection Time: 06/21/20  9:21 AM  Result Value Ref Range   WBC 14.4 (H) 4.0 - 10.5 K/uL   RBC 4.15 3.87 - 5.11 MIL/uL   Hemoglobin 12.5 12.0 - 15.0 g/dL   HCT 33.3 (L) 36 - 46 %   MCV 83.9 80.0 - 100.0 fL   MCH 30.1 26.0 - 34.0 pg   MCHC 35.9 30.0 - 36.0 g/dL   RDW 54.5 62.5 - 63.8 %   Platelets 217 150 - 400 K/uL   nRBC 0.0 0.0 - 0.2 %    MAU Course  Procedures    MDM   Assessment and Plan  1. [redacted] week gestation 2. Previable pprom  With patient's cramping and contractions, maybe in preterm labor. As patient previable pprom, no indication to stop labor. Will admit and monitor for now.  CBC in AM.   I discussed with the patient that as baby is previable, that stopping labor is not indicated and that we are unable to resuscitate baby. Patient demonstrated understanding.  Discussed pt with Dr Jolayne Panther, who agreed with above and will assume care.  Levie Heritage 06/21/2020, 9:23 AM

## 2020-06-21 NOTE — Discharge Summary (Signed)
Postpartum Discharge Summary     Patient Name: Amanda Vincent DOB: 2001/07/26 MRN: [id number]  Date of admission: 06/21/2020 Delivery date:06/21/2020  Delivering provider: Takeisha Cianci  Date of discharge: 06/22/2020  Admitting diagnosis: Preterm premature rupture of membranes (PPROM) with unknown onset of labor [O42.919] Intrauterine pregnancy: [redacted]w[redacted]d     Secondary diagnosis:  Active Problems:   Preterm premature rupture of membranes (PPROM) with unknown onset of labor  Discharge diagnosis: previable delivery                                               Hospital course: Patient diagnosed with previable PPROM on 10/28 presenting today with vaginal bleeding and lower abdominal contractions. WBC 14. Findings suspicious for chorioamnionitis. Patient desired expectant management. Over the course of her admission, pain increased and patient delivered a viable female infant. Placenta subsequently delivered whole and intact. Patient had an uncomplicated postpartum course. She tolerated a regular diet, ambulated and voided without difficulty. Patient stable for discharge. Discharge instructions reviewed. Patient will be scheduled for a mood check with integrated behavioral health   Physical exam  Vitals:   06/22/20 0000 06/22/20 0422 06/22/20 0424 06/22/20 0755  BP: (!) 104/49 (!) 88/51 (!) 84/50 105/65  Pulse: 66 (!) 57 64 76  Resp: 18 18  18   Temp: 97.7 F (36.5 C) 98.2 F (36.8 C)  98.1 F (36.7 C)  TempSrc: Oral Oral  Oral  SpO2:  100%  99%  Weight:      Height:       General: alert, cooperative and no distress Lochia: appropriate Uterine Fundus: firm DVT Evaluation: No evidence of DVT seen on physical exam. Labs: Lab Results  Component Value Date   WBC 13.1 (H) 06/22/2020   HGB 10.9 (L) 06/22/2020   HCT 29.8 (L) 06/22/2020   MCV 82.3 06/22/2020   PLT 192 06/22/2020   CMP Latest Ref Rng & Units 12/30/2019  Glucose 70 - 99 mg/dL 77  BUN 6 - 20 mg/dL 11  Creatinine  02/29/2020 - 1.00 mg/dL 8.12  Sodium 7.51 - 700 mmol/L 141  Potassium 3.5 - 5.1 mmol/L 3.3(L)  Chloride 98 - 111 mmol/L 105  CO2 22 - 32 mmol/L 27  Calcium 8.9 - 10.3 mg/dL 9.0  Total Protein 6.5 - 8.1 g/dL 7.5  Total Bilirubin 0.3 - 1.2 mg/dL 0.5  Alkaline Phos 38 - 126 U/L 59  AST 15 - 41 U/L 16  ALT 0 - 44 U/L 10   Edinburgh Score: No flowsheet data found.   After visit meds:  Allergies as of 06/22/2020      Reactions   Corn-containing Products    Dog Epithelium    Mold Extract [trichophyton]    Peanut-containing Drug Products    Sesame Seed (diagnostic)       Medication List    TAKE these medications   acetaminophen 325 MG tablet Commonly known as: TYLENOL Take 650 mg by mouth every 6 (six) hours as needed.   cetirizine 10 MG tablet Commonly known as: ZYRTEC Take 10 mg by mouth daily.   ibuprofen 600 MG tablet Commonly known as: ADVIL Take 1 tablet (600 mg total) by mouth every 6 (six) hours.   M-Natal Plus 27-1 MG Tabs TAKE 1 TABLET BY MOUTH EVERY DAY   PRENATAL ADULT GUMMY/DHA/FA PO Take by mouth.  Discharge home in stable condition Infant Disposition:morgue Discharge instruction: per After Visit Summary and Postpartum booklet. Activity: Advance as tolerated. Pelvic rest for 6 weeks.  Diet: routine diet Future Appointments: Future Appointments  Date Time Provider Department Center  06/24/2020 12:45 PM [facility]       06/22/2020 Catalina Antigua, MD

## 2020-06-21 NOTE — Progress Notes (Signed)
Patient ID: Amanda Vincent, female   DOB: 30-Nov-2000, 19 y.o.   MRN: [id number] Called to evaluate patient for delivery. Arrived in room with viable fetus in patient arms. Cord clamped and cut with minimal vaginal bleeding. Patient and partner tending to newborn. Pitocin started. Emotional support provided. Placenta delivered whole and intact; it was sent to pathology. EBL 100 cc

## 2020-06-21 NOTE — H&P (Signed)
History     CSN: [id number]  Arrival date and time: 06/21/20 0254   First Provider Initiated Contact with Patient 06/21/20 [phone]      Chief Complaint  Patient presents with  . Vaginal Bleeding  . Abdominal Pain   HPI This is a 19 year old G1, P0 at 18 weeks and 5 days.  She had been receiving care with the health department.  She presented to the MAU on 10/28 and was diagnosed with previable PPROM.  She was discharged home management.  Was given strict return precautions.  Last night and this morning, she had experienced a couple episodes of vaginal bleeding when she stopped using the bathroom.  In addition, she started having some mild cramping this morning.  Denies fevers, chills, nausea, vomiting.   OB History    Gravida  1   Para      Term      Preterm      AB      Living  0     SAB      TAB      Ectopic      Multiple      Live Births              Past Medical History:  Diagnosis Date  . Acne   . Anxiety   . Asthma     Past Surgical History:  Procedure Laterality Date  . NO PAST SURGERIES      History reviewed. No pertinent family history.  Social History   Tobacco Use  . Smoking status: Never Smoker  . Smokeless tobacco: Never Used  Vaping Use  . Vaping Use: Never used  Substance Use Topics  . Alcohol use: Not Currently  . Drug use: Not Currently    Types: Marijuana    Allergies:  Allergies  Allergen Reactions  . Corn-Containing Products   . Dog Epithelium   . Mold Extract [Trichophyton]   . Peanut-Containing Drug Products   . Sesame Seed (Diagnostic)     Medications Prior to Admission  Medication Sig Dispense Refill Last Dose  . acetaminophen (TYLENOL) 325 MG tablet Take 650 mg by mouth every 6 (six) hours as needed.   Past Week at Unknown time  . cetirizine (ZYRTEC) 10 MG tablet Take 10 mg by mouth daily.   06/21/2020 at Unknown time  . Prenatal Vit-Fe Fumarate-FA (M-NATAL PLUS) 27-1 MG TABS TAKE 1 TABLET BY MOUTH EVERY DAY  30 tablet 12 06/21/2020 at Unknown time  . Prenatal MV & Min w/FA-DHA (PRENATAL ADULT GUMMY/DHA/FA PO) Take by mouth.       Review of Systems Physical Exam   Blood pressure 122/67, pulse 97, temperature 98.7 F (37.1 C), temperature source Oral, resp. rate 18, height 5' 2.5" (1.588 m), weight 63.8 kg, last menstrual period 02/11/2020, SpO2 100 %.  Physical Exam Vitals reviewed. Exam conducted with a chaperone present.  Constitutional:      Appearance: She is well-developed.  Abdominal:     General: Bowel sounds are normal.     Tenderness: There is no abdominal tenderness. There is no right CVA tenderness, left CVA tenderness, guarding or rebound.  Genitourinary:    Vagina: No signs of injury and foreign body.     Comments: Pooling of clear fluid. No bleeding on speculum exam. Skin:    Capillary Refill: Capillary refill takes less than 2 seconds.  Neurological:     Mental Status: She is alert.    Dilation: 1 Effacement (%): 50 Station: -  3 Exam by:: Sharene Krikorian, DO   Results for orders placed or performed during the hospital encounter of 06/21/20 (from the past 24 hour(s))  Urinalysis, Routine w reflex microscopic Urine, Clean Catch     Status: Abnormal   Collection Time: 06/21/20  9:12 AM  Result Value Ref Range   Color, Urine YELLOW YELLOW   APPearance HAZY (A) CLEAR   Specific Gravity, Urine 1.019 1.005 - 1.030   pH 7.0 5.0 - 8.0   Glucose, UA NEGATIVE NEGATIVE mg/dL   Hgb urine dipstick SMALL (A) NEGATIVE   Bilirubin Urine NEGATIVE NEGATIVE   Ketones, ur NEGATIVE NEGATIVE mg/dL   Protein, ur NEGATIVE NEGATIVE mg/dL   Nitrite NEGATIVE NEGATIVE   Leukocytes,Ua SMALL (A) NEGATIVE   RBC / HPF 0-5 0 - 5 RBC/hpf   WBC, UA 11-20 0 - 5 WBC/hpf   Bacteria, UA NONE SEEN NONE SEEN   Squamous Epithelial / LPF 0-5 0 - 5   Mucus PRESENT   Wet prep, genital     Status: Abnormal   Collection Time: 06/21/20  9:20 AM  Result Value Ref Range   Yeast Wet Prep HPF POC NONE SEEN NONE  SEEN   Trich, Wet Prep NONE SEEN NONE SEEN   Clue Cells Wet Prep HPF POC NONE SEEN NONE SEEN   WBC, Wet Prep HPF POC MANY (A) NONE SEEN   Sperm NONE SEEN   CBC     Status: Abnormal   Collection Time: 06/21/20  9:21 AM  Result Value Ref Range   WBC 14.4 (H) 4.0 - 10.5 K/uL   RBC 4.15 3.87 - 5.11 MIL/uL   Hemoglobin 12.5 12.0 - 15.0 g/dL   HCT 34.8 (L) 36 - 46 %   MCV 83.9 80.0 - 100.0 fL   MCH 30.1 26.0 - 34.0 pg   MCHC 35.9 30.0 - 36.0 g/dL   RDW 13.4 11.5 - 15.5 %   Platelets 217 150 - 400 K/uL   nRBC 0.0 0.0 - 0.2 %    MAU Course  Procedures    MDM   Assessment and Plan  1. [redacted] week gestation 2. Previable pprom  With patient's cramping and contractions, maybe in preterm labor. As patient previable pprom, no indication to stop labor. Will admit and monitor for now.  CBC in AM.   I discussed with the patient that as baby is previable, that stopping labor is not indicated and that we are unable to resuscitate baby. Patient demonstrated understanding.  Discussed pt with Dr Constant, who agreed with above and will assume care.  Tareka Jhaveri J Courtni Balash 06/21/2020, 9:23 AM  

## 2020-06-21 NOTE — Progress Notes (Signed)
Chaplain visited with patient and her partner to provide support after the early delivery of their son. Both parents shared that they had not named the baby yet because they had not officially heard the baby's sex, but both had a sense that they were having a boy. MOB shared that she was feeling exhausted and somewhat nauseated. Spent some time offering support and education about some of the choices families make to make to create memories. Left family to spend time with the baby and call family while baby is still living.  Please page as further needs arise.  Maryanna Shape. Carley Hammed, M.Div. [facility] Chaplain Pager [phone] Office [phone]

## 2020-06-21 NOTE — MAU Note (Signed)
Last night noted some bleeding when she wiped, again this morning, pinkish brown. Last time she used restroom, there wasn't any.  Is having some pain in suprapubic area, woke her up around 5, feels hard.  Denies pain with urination

## 2020-06-21 NOTE — Progress Notes (Signed)
RN at bedside to provide pp care and scheduled meds.  RN noticed that the Cuddle cot has been turned up as warm as the temperature goes. The pt states that they are warming up the baby and want to keep the cuddle cot turned up all the way.  The room temperature has also been turned up all the way.

## 2020-06-21 NOTE — Progress Notes (Signed)
RN arrived in room to obtain measurements of fetus. Temp has been turned down on cuddle cot as well as in room. Per patient, FOB has left for the evening, she is talking to him on phone. Pt desires to have molds made of hands and feet of infant if possible. Measurements were obtained and footprints made.

## 2020-06-21 NOTE — Consult Note (Signed)
Neonatology Consultation: Requested by: Constant Reason: demise of previable baby  Although the delivery note mentioned that the baby was "viable," he was clearly very premature and would not have survived, with his physical appearance consistent with the estimated gestational age.  On my exam the patient was clearly deceased, cold, and without pulse.  I discussed with the parents that even a transient heart beat does not mean that a baby this immature can be resuscitated, and I explained that the baby had died.  They voiced understanding that future pregnancies might be at higher risk for miscarriage.  R.L. Minus Breeding.D.

## 2020-06-22 ENCOUNTER — Encounter (HOSPITAL_COMMUNITY): Payer: Self-pay

## 2020-06-22 DIAGNOSIS — Z3A18 18 weeks gestation of pregnancy: Secondary | ICD-10-CM | POA: Diagnosis not present

## 2020-06-22 DIAGNOSIS — O42912 Preterm premature rupture of membranes, unspecified as to length of time between rupture and onset of labor, second trimester: Secondary | ICD-10-CM | POA: Diagnosis present

## 2020-06-22 DIAGNOSIS — O411220 Chorioamnionitis, second trimester, not applicable or unspecified: Secondary | ICD-10-CM | POA: Diagnosis present

## 2020-06-22 DIAGNOSIS — Z20822 Contact with and (suspected) exposure to covid-19: Secondary | ICD-10-CM | POA: Diagnosis present

## 2020-06-22 LAB — CBC
HCT: 29.8 % — ABNORMAL LOW (ref 36.0–46.0)
Hemoglobin: 10.9 g/dL — ABNORMAL LOW (ref 12.0–15.0)
MCH: 30.1 pg (ref 26.0–34.0)
MCHC: 36.6 g/dL — ABNORMAL HIGH (ref 30.0–36.0)
MCV: 82.3 fL (ref 80.0–100.0)
Platelets: 192 10*3/uL (ref 150–400)
RBC: 3.62 MIL/uL — ABNORMAL LOW (ref 3.87–5.11)
RDW: 13.2 % (ref 11.5–15.5)
WBC: 13.1 10*3/uL — ABNORMAL HIGH (ref 4.0–10.5)
nRBC: 0 % (ref 0.0–0.2)

## 2020-06-22 LAB — GC/CHLAMYDIA PROBE AMP (~~LOC~~) NOT AT ARMC
Chlamydia: NEGATIVE
Comment: NEGATIVE
Comment: NORMAL
Neisseria Gonorrhea: NEGATIVE

## 2020-06-22 MED ORDER — IBUPROFEN 600 MG PO TABS
600.0000 mg | ORAL_TABLET | Freq: Four times a day (QID) | ORAL | 0 refills | Status: DC
Start: 2020-06-22 — End: 2021-02-10

## 2020-06-22 NOTE — Plan of Care (Signed)

## 2020-06-22 NOTE — Discharge Instructions (Signed)
Premature Rupture and Preterm Premature Rupture of Membranes  Rupture of membranes is when the membranes (amniotic sac) that hold your baby break open. This is commonly referred to as your "water breaking." If your water breaks before labor starts (prematurely), it is called premature rupture of membranes (PROM). If PROM occurs before 37 weeks of pregnancy, it is called preterm premature rupture of membranes (PPROM). Because the amniotic sac keeps infection out and performs other important functions, having the amniotic sac rupture before 37 weeks of pregnancy can lead to serious problems. It requires immediate attention from a health care provider. What are the causes? When PROM occurs at 37 weeks of pregnancy or later, it is usually caused by natural weakening of the membranes and friction caused by contractions. PPROM is usually caused by infection. In many cases, the cause is not known. What increases the risk of PPROM? The following factors may make you more likely to have PPROM:  Infection.  Having had PPROM in a previous pregnancy.  Short cervical length.  Bleeding during the second or third trimester.  Low BMI, which is an estimate of body fat.  Smoking.  Using drugs.  Low socioeconomic status. What problems can be caused by PROM and PPROM? This condition creates health dangers for the mother and the baby. These include:  Delivering a premature baby.  Getting a serious infection of the placental tissues (chorioamnionitis).  Early detachment of the placenta from the uterus (placental abruption).  Compression of the umbilical cord.  Developing a serious infection after delivery. What are the signs of PROM and PPROM? Signs of this condition include:  A sudden gush or slow leaking of fluid from the vagina.  Charday Capetillo wet underwear. Sometimes, women mistake the leaking or wetness for urine, especially if the leak is slow and not a gush of fluid. If there is Mylena Sedberry  leaking or if your underwear continues to get wet, your membranes have likely ruptured. What should I do if I think my membranes have ruptured?  Call your health care provider right away.  You will need to go to the hospital immediately to be checked by a health care provider. What happens if I am diagnosed with PROM or PPROM? Once you arrive at the hospital, you will have tests done. A cervical exam will be done using a lubricated instrument (speculum) to check whether the cervix has softened or started to open (dilate).  If you are diagnosed with PROM, your labor may be started for you (you may be induced) within 24 hours if you are not having contractions.  If you are diagnosed with PPROM and you are not having contractions, you may be induced depending on your trimester. If you have PPROM:  You and your baby will be monitored closely for signs of infection or other complications.  You may be given: ? An antibiotic medicine to lower the chances of developing an infection. ? A steroid medicine to help mature the baby's lungs more quickly. ? A medicine to help prevent cerebral palsy in your baby. ? A medicine to stop preterm labor.  You may be ordered to be on bed rest at home or in the hospital.  You may be induced if complications occur for you or the baby. Your treatment will depend on many factors, such as how many weeks you have been pregnant (how far along you are), the development of the baby, and other complications that may occur. This information is not intended to replace advice given to  you by your health care provider. Make sure you discuss any questions you have with your health care provider. Document Revised: 11/29/2018 Document Reviewed: 03/13/2016 Elsevier Patient Education  2020 [employer] Inc[employer]   Postpartum Care After Vaginal Delivery This sheet gives you information about how to care for yourself from the time you deliver your baby to up to 6-12 weeks after  delivery (postpartum period). Your health care provider may also give you more specific instructions. If you have problems or questions, contact your health care provider. Follow these instructions at home: Vaginal bleeding  It is normal to have vaginal bleeding (lochia) after delivery. Wear a sanitary pad for vaginal bleeding and discharge. ? During the first week after delivery, the amount and appearance of lochia is often similar to a menstrual period. ? Over the next few weeks, it will gradually decrease to a dry, yellow-brown discharge. ? For most women, lochia stops completely by 4-6 weeks after delivery. Vaginal bleeding can vary from woman to woman.  Change your sanitary pads frequently. Watch for any changes in your flow, such as: ? A sudden increase in volume. ? A change in color. ? Large blood clots.  If you pass a blood clot from your vagina, save it and call your health care provider to discuss. Do not flush blood clots down the toilet before talking with your health care provider.  Do not use tampons or douches until your health care provider says this is safe.  If you are not breastfeeding, your period should return 6-8 weeks after delivery. If you are feeding your child breast milk only (exclusive breastfeeding), your period may not return until you stop breastfeeding. Perineal care  Keep the area between the vagina and the anus (perineum) clean and dry as told by your health care provider. Use medicated pads and pain-relieving sprays and creams as directed.  If you had a cut in the perineum (episiotomy) or a tear in the vagina, check the area for signs of infection until you are healed. Check for: ? More redness, swelling, or pain. ? Fluid or blood coming from the cut or tear. ? Warmth. ? Pus or a bad smell.  You may be given a squirt bottle to use instead of wiping to clean the perineum area after you go to the bathroom. As you start healing, you may use the squirt  bottle before wiping yourself. Make sure to wipe gently.  To relieve pain caused by an episiotomy, a tear in the vagina, or swollen veins in the anus (hemorrhoids), try taking a warm sitz bath 2-3 times a day. A sitz bath is a warm water bath that is taken while you are sitting down. The water should only come up to your hips and should cover your buttocks. Breast care  Within the first few days after delivery, your breasts may feel heavy, full, and uncomfortable (breast engorgement). Milk may also leak from your breasts. Your health care provider can suggest ways to help relieve the discomfort. Breast engorgement should go away within a few days.  If you are breastfeeding: ? Wear a bra that supports your breasts and fits you well. ? Keep your nipples clean and dry. Apply creams and ointments as told by your health care provider. ? You may need to use breast pads to absorb milk that leaks from your breasts. ? You may have uterine contractions every time you breastfeed for up to several weeks after delivery. Uterine contractions help your uterus return to its normal  size. ? If you have any problems with breastfeeding, work with your health care provider or Advertising copywriter.  If you are not breastfeeding: ? Avoid touching your breasts a lot. Doing this can make your breasts produce more milk. ? Wear a good-fitting bra and use cold packs to help with swelling. ? Do not squeeze out (express) milk. This causes you to make more milk. Intimacy and sexuality  Ask your health care provider when you can engage in sexual activity. This may depend on: ? Your risk of infection. ? How fast you are healing. ? Your comfort and desire to engage in sexual activity.  You are able to get pregnant after delivery, even if you have not had your period. If desired, talk with your health care provider about methods of birth control (contraception). Medicines  Take over-the-counter and prescription medicines  only as told by your health care provider.  If you were prescribed an antibiotic medicine, take it as told by your health care provider. Do not stop taking the antibiotic even if you start to feel better. Activity  Gradually return to your normal activities as told by your health care provider. Ask your health care provider what activities are safe for you.  Rest as much as possible. Try to rest or take a nap while your baby is sleeping. Eating and drinking   Drink enough fluid to keep your urine pale yellow.  Eat high-fiber foods every day. These may help prevent or relieve constipation. High-fiber foods include: ? Whole grain cereals and breads. ? Brown rice. ? Beans. ? Fresh fruits and vegetables.  Do not try to lose weight quickly by cutting back on calories.  Take your prenatal vitamins until your postpartum checkup or until your health care provider tells you it is okay to stop. Lifestyle  Do not use any products that contain nicotine or tobacco, such as cigarettes and e-cigarettes. If you need help quitting, ask your health care provider.  Do not drink alcohol, especially if you are breastfeeding. General instructions  Keep all follow-up visits for you and your baby as told by your health care provider. Most women visit their health care provider for a postpartum checkup within the first 3-6 weeks after delivery. Contact a health care provider if:  You feel unable to cope with the changes that your child brings to your life, and these feelings do not go away.  You feel unusually sad or worried.  Your breasts become red, painful, or hard.  You have a fever.  You have trouble holding urine or keeping urine from leaking.  You have little or no interest in activities you used to enjoy.  You have not breastfed at all and you have not had a menstrual period for 12 weeks after delivery.  You have stopped breastfeeding and you have not had a menstrual period for 12 weeks  after you stopped breastfeeding.  You have questions about caring for yourself or your baby.  You pass a blood clot from your vagina. Get help right away if:  You have chest pain.  You have difficulty breathing.  You have sudden, severe leg pain.  You have severe pain or cramping in your lower abdomen.  You bleed from your vagina so much that you fill more than one sanitary pad in one hour. Bleeding should not be heavier than your heaviest period.  You develop a severe headache.  You faint.  You have blurred vision or spots in your vision.  You  have bad-smelling vaginal discharge.  You have thoughts about hurting yourself or your baby. If you ever feel like you may hurt yourself or others, or have thoughts about taking your own life, get help right away. You can go to the nearest emergency department or call:  Your local emergency services (911 in the [place]  A suicide crisis helpline, such as the National Suicide Prevention Lifeline at 8732452172. This is open 24 hours a day. Summary  The period of time right after you deliver your newborn up to 6-12 weeks after delivery is called the postpartum period.  Gradually return to your normal activities as told by your health care provider.  Keep all follow-up visits for you and your baby as told by your health care provider. This information is not intended to replace advice given to you by your health care provider. Make sure you discuss any questions you have with your health care provider. Document Revised: 08/10/2017 Document Reviewed: 05/21/2017 Elsevier Patient Education  2020 [employer].

## 2020-06-22 NOTE — Progress Notes (Signed)
Pt given comfort items for memories. Pictures of fetus taken. Pt appears to understand that fetus is no longer living and acknowledges the need to choose a funeral home for arrangements of remains. Information given. Birth certificate worksheet and instructions given. After discussion of state of the fetus, it was agreed that attempting to make molds would cause damage to body. Pt states understanding.

## 2020-06-23 ENCOUNTER — Telehealth: Payer: Self-pay

## 2020-06-23 NOTE — Telephone Encounter (Addendum)
Transition Care Management Unsuccessful Follow-up Telephone Call  Date of discharge and from where:  06/22/2020 from [facility]  Attempts:  1st Attempt  Reason for unsuccessful TCM follow-up call:  Left voice message   Patient was seen for OBYGN reason - delivery of previable fetus earlier than 20 weeks

## 2020-06-24 ENCOUNTER — Telehealth: Payer: Self-pay | Admitting: Family Medicine

## 2020-06-24 ENCOUNTER — Ambulatory Visit: Payer: Medicaid Other

## 2020-06-24 LAB — SURGICAL PATHOLOGY

## 2020-06-24 NOTE — Telephone Encounter (Signed)
Transition Care Management Unsuccessful Follow-up Telephone Call  Date of discharge and from where: 06/22/2020 from [facility]  Attempts:  2nd Attempt  Reason for unsuccessful TCM follow-up call:  Left voice message

## 2020-06-24 NOTE — Telephone Encounter (Signed)
Left a voicemail with patient to call back to schedule a PP visit

## 2020-06-25 NOTE — Telephone Encounter (Signed)
Transition Care Management Unsuccessful Follow-up Telephone Call  Date of discharge and from where: 06/22/2020 from [facility]  Attempts:  3rd Attempt  Reason for unsuccessful TCM follow-up call:  Left voice message

## 2020-06-28 ENCOUNTER — Encounter: Payer: Medicaid Other | Admitting: Family Medicine

## 2020-06-29 NOTE — BH Specialist Note (Signed)
error 

## 2020-06-30 ENCOUNTER — Ambulatory Visit: Payer: Medicaid Other | Admitting: Clinical

## 2020-07-06 ENCOUNTER — Telehealth: Payer: Self-pay

## 2020-07-06 ENCOUNTER — Ambulatory Visit: Payer: Medicaid Other | Admitting: Family Medicine

## 2020-07-06 NOTE — Telephone Encounter (Signed)
Called pt to follow up regarding missed appointment. VM left encouraging pt to reschedule appt. Callback number given.

## 2020-07-18 ENCOUNTER — Other Ambulatory Visit: Payer: Self-pay | Admitting: Student in an Organized Health Care Education/Training Program

## 2020-08-11 ENCOUNTER — Ambulatory Visit (INDEPENDENT_AMBULATORY_CARE_PROVIDER_SITE_OTHER): Payer: Medicaid Other | Admitting: Family Medicine

## 2020-08-11 ENCOUNTER — Other Ambulatory Visit: Payer: Self-pay

## 2020-08-11 ENCOUNTER — Encounter: Payer: Self-pay | Admitting: Family Medicine

## 2020-08-11 DIAGNOSIS — O42919 Preterm premature rupture of membranes, unspecified as to length of time between rupture and onset of labor, unspecified trimester: Secondary | ICD-10-CM | POA: Diagnosis not present

## 2020-08-11 NOTE — Patient Instructions (Signed)
If you are in need of transportation to get to and from your appointments in our office.  You can reach Transportation Services by calling [phone] Monday - Friday  7am-6pm.   

## 2020-08-11 NOTE — Progress Notes (Signed)
   GYNECOLOGY OFFICE VISIT NOTE  History:   Amanda Vincent is a 19 y.o. G1P0 here today for follow up of 18wk pregnancy loss.  Patient first seen on 06/18/2020 in [facility], diagnosed with PPROM Returned on 06/21/2020 and delivered at [redacted]w[redacted]d in setting of likely chorioamnionitis  Doing well, still feeling a little sad but overall hopefull Would like to have another baby, currently trying Still having some on and off bleeding but not heavy No abdominal pain Still taking prenatal vitamins   Past Medical History:  Diagnosis Date  . Acne   . Anxiety   . Asthma     Past Surgical History:  Procedure Laterality Date  . NO PAST SURGERIES      The following portions of the patient's history were reviewed and updated as appropriate: allergies, current medications, past family history, past medical history, past social history, past surgical history and problem list.   Health Maintenance:  Normal pap and negative HRHPV: n/a.  Normal mammogram: n/a.   Review of Systems:  Pertinent items noted in HPI and remainder of comprehensive ROS otherwise negative.  Physical Exam:  BP 129/87   Pulse 83   Wt 137 lb 12.8 oz (62.5 kg)   Breastfeeding No   BMI 24.80 kg/m  CONSTITUTIONAL: Well-developed, well-nourished female in no acute distress.  HEENT:  Normocephalic, atraumatic. External right and left ear normal. No scleral icterus.  NECK: Normal range of motion, supple, no masses noted on observation SKIN: No rash noted. Not diaphoretic. No erythema. No pallor. MUSCULOSKELETAL: Normal range of motion. No edema noted. NEUROLOGIC: Alert and oriented to person, place, and time. Normal muscle tone coordination.  PSYCHIATRIC: Normal mood and affect. Normal behavior. Normal judgment and thought content. RESPIRATORY: Effort normal, no problems with respiration noted ABDOMEN: No masses noted. No other overt distention noted.   PELVIC: Deferred  Labs and Imaging No results found for this or any  previous visit (from the past 168 hour(s)). No results found.    Assessment and Plan:   Problem List Items Addressed This Visit      Other   Preterm premature rupture of membranes (PPROM) with unknown onset of labor    Overall doing well. Declines contraception, would like to try again, discussed many early losses are not indicative of future pregnancy loss though somewhat cautious about this with 2nd trimester loss, and that there is no defined optimal interval to wait. Encouraged to cont taking PNV and start a baby aspirin, take UPTs regularly as her cycle may not normalize before getting pregnancy again.          Routine preventative health maintenance measures emphasized. Please refer to After Visit Summary for other counseling recommendations.   Return if symptoms worsen or fail to improve.    Total face-to-face time with patient: 20 minutes.  Over 50% of encounter was spent on counseling and coordination of care.   Venora Maples, MD/[facility] [employer], [facility]

## 2020-08-11 NOTE — Assessment & Plan Note (Signed)
Overall doing well. Declines contraception, would like to try again, discussed many early losses are not indicative of future pregnancy loss though somewhat cautious about this with 2nd trimester loss, and that there is no defined optimal interval to wait. Encouraged to cont taking PNV and start a baby aspirin, take UPTs regularly as her cycle may not normalize before getting pregnancy again.

## 2020-08-21 NOTE — L&D Delivery Note (Signed)
Delivery Note non-viable 19wk fetus was delivered via Vaginal, Spontaneous (breech).  APGAR: 0. Weight pending.  Placenta also delivered, plan to send to pathology Anesthesia: None Episiotomy:  none Lacerations:  none Est. Blood Loss (mL):  800cc  Baby at bedside currently.  Mom stable.  Reviewed care plan for mom- advance diet, monitor bleeding. If VSS and bleeding appropriate, possible discharge home later today.   Myna Hidalgo, DO Attending Obstetrician & Gynecologist, [facility] [employer], [facility]

## 2020-10-24 ENCOUNTER — Other Ambulatory Visit: Payer: Self-pay

## 2020-10-24 ENCOUNTER — Encounter (HOSPITAL_COMMUNITY): Payer: Self-pay

## 2020-10-24 ENCOUNTER — Emergency Department (HOSPITAL_COMMUNITY)
Admission: EM | Admit: 2020-10-24 | Discharge: 2020-10-24 | Disposition: A | Discharge status: Home / Self Care | Payer: Medicaid Other | Attending: Emergency Medicine | Admitting: Emergency Medicine

## 2020-10-24 DIAGNOSIS — Z91010 Allergy to peanuts: Secondary | ICD-10-CM | POA: Insufficient documentation

## 2020-10-24 DIAGNOSIS — R0602 Shortness of breath: Secondary | ICD-10-CM | POA: Diagnosis present

## 2020-10-24 DIAGNOSIS — J45901 Unspecified asthma with (acute) exacerbation: Secondary | ICD-10-CM | POA: Insufficient documentation

## 2020-10-24 MED ORDER — ALBUTEROL SULFATE HFA 108 (90 BASE) MCG/ACT IN AERS
2.0000 | INHALATION_SPRAY | RESPIRATORY_TRACT | Status: DC | PRN
  Administered 2020-10-24: 2 via RESPIRATORY_TRACT
  Filled 2020-10-24: qty 6.7

## 2020-10-24 MED ORDER — IPRATROPIUM-ALBUTEROL 0.5-2.5 (3) MG/3ML IN SOLN
3.0000 mL | Freq: Once | RESPIRATORY_TRACT | Status: AC
  Administered 2020-10-24: 3 mL via RESPIRATORY_TRACT
  Filled 2020-10-24: qty 3

## 2020-10-24 NOTE — ED Provider Notes (Signed)
[facility]-EMERGENCY DEPT Provider Note   CSN: [id number] Arrival date & time: 10/24/20  1814     History No chief complaint on file.   Amanda Vincent is a 20 y.o. female history of asthma, anxiety otherwise healthy.  Patient presents to the ER today for evaluation of difficulty breathing.  Patient reports that today she was moving furniture around, around 30 minutes prior to arrival she removed a box and dust flew into her face.  This caused her to have a sneezing fit, afterwards she began wheezing.  She reports this is consistent with her normal presentation of an asthma attack.  Unfortunately patient did not have her albuterol inhaler with her.  Patient reports that she felt her symptoms were gradually improving but her mother wanted her to be evaluated so she came to the ER today.  Prior to my evaluation patient was given albuterol inhaler and spacer by nursing staff and she reports improvement of her symptoms.  Patient reports her shortness of breath earlier felt like a mild-moderate intensity wheezing sensation in her chest, no radiation of symptoms no aggravating factors  Denies recent illness, fever/chills, chest pain, cough/, pain, nausea/vomiting, extremity swelling/color concerns.  HPI     Past Medical History:  Diagnosis Date  . Acne   . Anxiety   . Asthma     Patient Active Problem List   Diagnosis Date Noted  . Preterm premature rupture of membranes (PPROM) with unknown onset of labor 06/21/2020  . Anhydramnios 06/17/2020  . Contraceptive management 08/05/2018  . Acne vulgaris 04/29/2017    Past Surgical History:  Procedure Laterality Date  . NO PAST SURGERIES       OB History    Gravida  1   Para      Term      Preterm      AB      Living  0     SAB      IAB      Ectopic      Multiple      Live Births              Family History  Problem Relation Age of Onset  . Healthy Mother   . Healthy Father      Social History   Tobacco Use  . Smoking status: Never Smoker  . Smokeless tobacco: Never Used  Vaping Use  . Vaping Use: Never used  Substance Use Topics  . Alcohol use: Not Currently  . Drug use: Not Currently    Types: Marijuana    Home Medications Prior to Admission medications   Medication Sig Start Date End Date Taking? Authorizing Provider  cetirizine (ZYRTEC) 10 MG tablet Take 10 mg by mouth daily.    [provider]  ibuprofen (ADVIL) 600 MG tablet Take 1 tablet (600 mg total) by mouth every 6 (six) hours. 06/22/20   Constant, Peggy, MD  Prenatal Vit-Fe Fumarate-FA (M-NATAL PLUS) 27-1 MG TABS TAKE 1 TABLET BY MOUTH EVERY DAY 06/15/20   Anyanwu, Jethro Bastos, MD    Allergies    Corn-containing products, Dog epithelium, Mold extract [trichophyton], Peanut-containing drug products, and Sesame seed (diagnostic)  Review of Systems   Review of Systems Ten systems are reviewed and are negative for acute change except as noted in the HPI  Physical Exam Updated Vital Signs BP 137/79   Pulse 80   Temp 98.9 F (37.2 C)   Resp 18   Ht 5\' 2"  (1.575  m)   Wt 67.1 kg   LMP 10/04/2020   SpO2 100%   BMI 27.07 kg/m   Physical Exam Constitutional:      General: She is not in acute distress.    Appearance: Normal appearance. She is well-developed. She is not ill-appearing or diaphoretic.  HENT:     Head: Normocephalic and atraumatic.     Mouth/Throat:     Mouth: Mucous membranes are moist.     Pharynx: Oropharynx is clear.  Eyes:     General: Vision grossly intact. Gaze aligned appropriately.     Pupils: Pupils are equal, round, and reactive to light.  Neck:     Trachea: Trachea and phonation normal.  Cardiovascular:     Rate and Rhythm: Normal rate and regular rhythm.     Heart sounds: Normal heart sounds.  Pulmonary:     Effort: Pulmonary effort is normal. No respiratory distress.     Breath sounds: Wheezing (Mild bilateral expiratory wheezing) present.   Abdominal:     General: There is no distension.     Palpations: Abdomen is soft.     Tenderness: There is no abdominal tenderness. There is no guarding or rebound.  Musculoskeletal:        General: Normal range of motion.     Cervical back: Normal range of motion.  Skin:    General: Skin is warm and dry.  Neurological:     Mental Status: She is alert.     GCS: GCS eye subscore is 4. GCS verbal subscore is 5. GCS motor subscore is 6.     Comments: Speech is clear and goal oriented, follows commands Major Cranial nerves without deficit, no facial droop Moves extremities without ataxia, coordination intact  Psychiatric:        Behavior: Behavior normal.     ED Results / Procedures / Treatments   Labs (all labs ordered are listed, but only abnormal results are displayed) Labs Reviewed - No data to display  EKG None  Radiology No results found.  Procedures Procedures   Medications Ordered in ED Medications  albuterol (VENTOLIN HFA) 108 (90 Base) MCG/ACT inhaler 2 puff (2 puffs Inhalation Given 10/24/20 1825)  ipratropium-albuterol (DUONEB) 0.5-2.5 (3) MG/3ML nebulizer solution 3 mL (3 mLs Nebulization Given 10/24/20 1910)    ED Course  I have reviewed the triage vital signs and the nursing notes.  Pertinent labs & imaging results that were available during my care of the patient were reviewed by me and considered in my medical decision making (see chart for details).    MDM Rules/Calculators/A&P                         Additional history obtained from: 1. Nursing notes from this visit. 2. Review of electronic medical records. ------------------ 20 year old female presented for concern of asthma attack.  She was moving some old furniture and boxes today when she was hit in the face with dust, this caused her to be in sneezing and shortly after she developed wheezing consistent with her normal asthma.  She not have her albuterol inhaler with her today.  She received  albuterol prior to my initial evaluation reports symptoms are improving.  She is well-appearing on exam no acute distress.  Traders intact, airway clear, no meningismus.  Cardiopulmonary exam reveals some mild expiratory wheezing bilaterally otherwise unremarkable.  Abdomen soft nontender.  Vital signs are within normal limits on my evaluation.  Will give patient DuoNeb  for suspected asthma exacerbation and monitor.  Doubt ACS, PE, dissection, pneumonia, PTX, anemia or other emergent cardiopulmonary etiologies at this time.  Additionally no evidence for allergic reaction.  No indication for blood work or imaging, so decision making made with patient and she agrees. - Patient reevaluated, she is resting comfortably bed no acute distress vital signs stable.  She reports symptoms have completely resolved.  Lungs clear on reexamination she is requesting discharge, she is taking the albuterol inhaler and spacer with her.  I discussed potentially starting patient on short burst of prednisone patient declined.  I encouraged her to follow-up closely with her primary care provider  At this time there does not appear to be any evidence of an acute emergency medical condition and the patient appears stable for discharge with appropriate outpatient follow up. Diagnosis was discussed with patient who verbalizes understanding of care plan and is agreeable to discharge. I have discussed return precautions with patient who verbalizes understanding. Patient encouraged to follow-up with their PCP. All questions answered.  Patient's case discussed with Dr. Effie Shy who agrees with plan to discharge with follow-up.   Note: Portions of this report may have been transcribed using voice recognition software. Every effort was made to ensure accuracy; however, inadvertent computerized transcription errors may still be present. Final Clinical Impression(s) / ED Diagnoses Final diagnoses:  Exacerbation of asthma, unspecified asthma  severity, unspecified whether persistent    Rx / DC Orders ED Discharge Orders    None       Elizabeth Palau 10/24/20 2029    Mancel Bale, MD 10/25/20 1623

## 2020-10-24 NOTE — ED Triage Notes (Addendum)
Patient c/o asthma attack after moving furniture around and causing dust to flare up. Patient struggling to complete a sentence

## 2020-10-24 NOTE — Discharge Instructions (Addendum)
At this time there does not appear to be the presence of an emergent medical condition, however there is always the potential for conditions to change. Please read and follow the below instructions.  Please return to the Emergency Department immediately for any new or worsening symptoms. Please be sure to follow up with your Primary Care Provider within one week regarding your visit today; please call their office to schedule an appointment even if you are feeling better for a follow-up visit. You may continue use the albuterol inhaler and spacer given to you today to help with wheezing and symptoms of asthma.  If you feel this does not help with your symptoms please return immediately to the emergency department.  Go to the nearest Emergency Department immediately if: You have fever or chills You are short of breath even at rest. You get short of breath when doing very little activity. You have trouble eating, drinking, or talking. You have chest pain or tightness. You have a fast heartbeat. Your lips or fingernails start to turn blue. You are light-headed or dizzy, or you faint. Your peak flow is less than 50% of your personal best. You feel too tired to breathe normally. You have any new/concerning or worsening of symptoms   Please read the additional information packets attached to your discharge summary.  Do not take your medicine if  develop an itchy rash, swelling in your mouth or lips, or difficulty breathing; call 911 and seek immediate emergency medical attention if this occurs.  You may review your lab tests and imaging results in their entirety on your MyChart account.  Please discuss all results of fully with your primary care provider and other specialist at your follow-up visit.  Note: Portions of this text may have been transcribed using voice recognition software. Every effort was made to ensure accuracy; however, inadvertent computerized transcription errors may still be  present.

## 2020-10-27 ENCOUNTER — Other Ambulatory Visit: Payer: Self-pay

## 2020-10-27 ENCOUNTER — Encounter: Payer: Self-pay | Admitting: Student in an Organized Health Care Education/Training Program

## 2020-10-27 ENCOUNTER — Ambulatory Visit (INDEPENDENT_AMBULATORY_CARE_PROVIDER_SITE_OTHER): Payer: Medicaid Other | Admitting: Student in an Organized Health Care Education/Training Program

## 2020-10-27 DIAGNOSIS — L700 Acne vulgaris: Secondary | ICD-10-CM | POA: Diagnosis not present

## 2020-10-27 DIAGNOSIS — J4551 Severe persistent asthma with (acute) exacerbation: Secondary | ICD-10-CM | POA: Diagnosis not present

## 2020-10-27 MED ORDER — ALBUTEROL SULFATE HFA 108 (90 BASE) MCG/ACT IN AERS: 1.0000 | INHALATION_SPRAY | RESPIRATORY_TRACT | 2 refills | Status: AC | PRN

## 2020-10-27 MED ORDER — BENZOYL PEROXIDE WASH 5 % EX LIQD: Freq: Two times a day (BID) | CUTANEOUS | 3 refills | Status: DC

## 2020-10-27 MED ORDER — CLINDAMYCIN PHOS-BENZOYL PEROX 1.2-5 % EX GEL: 1.0000 | Freq: Two times a day (BID) | CUTANEOUS | 1 refills | Status: DC

## 2020-10-27 NOTE — Patient Instructions (Signed)
It was a pleasure to see you today!  To summarize our discussion for this visit:  For your asthma, I would like to see better control of your symptoms. I will refill your rescue inhaler and prescribe a daily controller.   I would like to have a formal evaluation done of your breathing to assess your lung function  I am refilling your acne treatments  Please continue to take your prenatal vitamins.   Some additional health maintenance measures we should update are: Health Maintenance Due  Topic Date Due  . Hepatitis C Screening  Never done  . HPV VACCINES (1 - 2-dose series) Never done  . HIV Screening  Never done  . TETANUS/TDAP  Never done  . INFLUENZA VACCINE  Never done  .   Please return to our clinic to see me in about 1 month to follow up with me on how your breathing is going.  Call the clinic at [phone] if your symptoms worsen or you have any concerns.   Thank you for allowing me to take part in your care,  Dr. Jamelle Rushing

## 2020-10-27 NOTE — Progress Notes (Signed)
    SUBJECTIVE:   CHIEF COMPLAINT / HPI: medication refill  Acne- well controlled with previous prescriptions of topical clindamycin and benzoyl peroxide. Requesting refills of both.   Asthma- patient has rescue inhaler only. Evaluated for asthma at early age. Had recent exacerbation presenting to ED 3/6 which she believes was related to dust trigger with moving. Since that time, she has been using inhaler 3-4 times daily and at least once per night. Denies any SOB or respiratory distress currently.   Desires pregnancy- taking daily prenatal vitamin. H/o spontaneous Ab 11/21. Not actively trying to get pregnant but "letting it happen if it happens".  OBJECTIVE:   BP 140/80   Pulse 92   Ht 5\' 2"  (1.575 m)   Wt 131 lb 12.8 oz (59.8 kg)   LMP 10/04/2020   SpO2 100%   BMI 24.11 kg/m   120/80 when rechecked by provider Physical Exam Constitutional:      General: She is not in acute distress.    Appearance: She is normal weight.  HENT:     Head: Normocephalic.     Nose: Nose normal.     Mouth/Throat:     Mouth: Mucous membranes are moist.  Cardiovascular:     Rate and Rhythm: Normal rate and regular rhythm.     Pulses: Normal pulses.     Heart sounds: No murmur heard.   Pulmonary:     Effort: Pulmonary effort is normal. No respiratory distress.     Breath sounds: Normal breath sounds. No wheezing.  Abdominal:     General: Bowel sounds are normal.     Palpations: Abdomen is soft.  Lymphadenopathy:     Cervical: No cervical adenopathy.  Skin:    General: Skin is warm.     Capillary Refill: Capillary refill takes less than 2 seconds.  Neurological:     General: No focal deficit present.     Mental Status: She is alert and oriented to person, place, and time.  Psychiatric:        Mood and Affect: Mood normal.    ASSESSMENT/PLAN:   Asthma Per patient report, her symptoms since recent acute flare are in severe- persistent category. Lung exam is normal in office  today. - refill rescue inhaler - prescribed ICS - referred for PFTs - discussed my recommendation that we get her asthma better controlled prior to pregnancy and patient endorsed agreement - consider allergy referral to identify triggers  Acne vulgaris Refilled clindamycin/benzoyl peroxide     10/06/2020, DO [facility]

## 2020-10-29 DIAGNOSIS — J45909 Unspecified asthma, uncomplicated: Secondary | ICD-10-CM | POA: Insufficient documentation

## 2020-10-29 MED ORDER — FLUTICASONE-SALMETEROL 100-50 MCG/DOSE IN AEPB: 1.0000 | INHALATION_SPRAY | Freq: Every day | RESPIRATORY_TRACT | 1 refills | Status: DC

## 2020-10-29 NOTE — Assessment & Plan Note (Signed)
Refilled clindamycin/benzoyl peroxide

## 2020-10-29 NOTE — Assessment & Plan Note (Signed)
Per patient report, her symptoms since recent acute flare are in severe- persistent category. Lung exam is normal in office today. - refill rescue inhaler - prescribed ICS - referred for PFTs - discussed my recommendation that we get her asthma better controlled prior to pregnancy and patient endorsed agreement - consider allergy referral to identify triggers

## 2020-11-16 DIAGNOSIS — Z3009 Encounter for other general counseling and advice on contraception: Secondary | ICD-10-CM | POA: Diagnosis not present

## 2020-11-16 DIAGNOSIS — Z3200 Encounter for pregnancy test, result unknown: Secondary | ICD-10-CM | POA: Diagnosis not present

## 2020-11-25 ENCOUNTER — Ambulatory Visit: Payer: Medicaid Other | Admitting: Student in an Organized Health Care Education/Training Program

## 2020-12-20 ENCOUNTER — Other Ambulatory Visit: Payer: Self-pay | Admitting: Student in an Organized Health Care Education/Training Program

## 2020-12-23 DIAGNOSIS — Z3481 Encounter for supervision of other normal pregnancy, first trimester: Secondary | ICD-10-CM | POA: Diagnosis not present

## 2020-12-23 LAB — OB RESULTS CONSOLE ABO/RH: RH Type: POSITIVE

## 2020-12-23 LAB — OB RESULTS CONSOLE GC/CHLAMYDIA
Chlamydia: NEGATIVE
Gonorrhea: NEGATIVE

## 2020-12-23 LAB — OB RESULTS CONSOLE HGB/HCT, BLOOD
HCT: 39 (ref 29–41)
Hemoglobin: 13.4

## 2020-12-23 LAB — OB RESULTS CONSOLE ANTIBODY SCREEN: Antibody Screen: NEGATIVE

## 2020-12-23 LAB — OB RESULTS CONSOLE PLATELET COUNT: Platelets: 284

## 2020-12-23 LAB — OB RESULTS CONSOLE VARICELLA ZOSTER ANTIBODY, IGG: Varicella: NON-IMMUNE/NOT IMMUNE

## 2020-12-23 LAB — OB RESULTS CONSOLE RPR: RPR: NONREACTIVE

## 2020-12-28 ENCOUNTER — Other Ambulatory Visit: Payer: Self-pay | Admitting: Obstetrics & Gynecology

## 2020-12-28 DIAGNOSIS — Z8279 Family history of other congenital malformations, deformations and chromosomal abnormalities: Secondary | ICD-10-CM

## 2020-12-28 DIAGNOSIS — Z363 Encounter for antenatal screening for malformations: Secondary | ICD-10-CM

## 2020-12-28 DIAGNOSIS — Z3A14 14 weeks gestation of pregnancy: Secondary | ICD-10-CM

## 2020-12-28 LAB — CYTOLOGY - PAP: Pap: ABNORMAL — AB

## 2021-01-04 ENCOUNTER — Encounter: Payer: Self-pay | Admitting: *Deleted

## 2021-01-05 ENCOUNTER — Encounter: Payer: Self-pay | Admitting: General Practice

## 2021-01-10 ENCOUNTER — Ambulatory Visit (HOSPITAL_BASED_OUTPATIENT_CLINIC_OR_DEPARTMENT_OTHER): Payer: Medicaid Other | Admitting: Genetic Counselor

## 2021-01-10 ENCOUNTER — Ambulatory Visit: Payer: Medicaid Other | Admitting: *Deleted

## 2021-01-10 ENCOUNTER — Other Ambulatory Visit: Payer: Self-pay

## 2021-01-10 ENCOUNTER — Encounter: Payer: Self-pay | Admitting: *Deleted

## 2021-01-10 ENCOUNTER — Ambulatory Visit: Payer: Medicaid Other | Attending: Obstetrics & Gynecology

## 2021-01-10 VITALS — BP 108/69 | HR 72

## 2021-01-10 DIAGNOSIS — Z363 Encounter for antenatal screening for malformations: Secondary | ICD-10-CM

## 2021-01-10 DIAGNOSIS — Z315 Encounter for genetic counseling: Secondary | ICD-10-CM | POA: Insufficient documentation

## 2021-01-10 DIAGNOSIS — O321XX0 Maternal care for breech presentation, not applicable or unspecified: Secondary | ICD-10-CM

## 2021-01-10 DIAGNOSIS — O09212 Supervision of pregnancy with history of pre-term labor, second trimester: Secondary | ICD-10-CM | POA: Diagnosis not present

## 2021-01-10 DIAGNOSIS — Z31430 Encounter of female for testing for genetic disease carrier status for procreative management: Secondary | ICD-10-CM

## 2021-01-10 DIAGNOSIS — D582 Other hemoglobinopathies: Secondary | ICD-10-CM | POA: Insufficient documentation

## 2021-01-10 DIAGNOSIS — Z8279 Family history of other congenital malformations, deformations and chromosomal abnormalities: Secondary | ICD-10-CM

## 2021-01-10 DIAGNOSIS — Z36 Encounter for antenatal screening for chromosomal anomalies: Secondary | ICD-10-CM

## 2021-01-10 DIAGNOSIS — O09292 Supervision of pregnancy with other poor reproductive or obstetric history, second trimester: Secondary | ICD-10-CM | POA: Diagnosis not present

## 2021-01-10 DIAGNOSIS — Z3A14 14 weeks gestation of pregnancy: Secondary | ICD-10-CM | POA: Diagnosis not present

## 2021-01-10 NOTE — Progress Notes (Signed)
01/10/2021  Amanda Vincent 2001/02/12 MRN: [id number] DOV: 01/10/2021  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 [name] was accompanied to her appointment by her partner, Amanda Vincent.   Indication for genetic counseling - FOB with previous child with congenital heart defect - Hemoglobin C trait  Prenatal history  [name] is a G2P0010, 20 y.o. female. Her current pregnancy has completed [redacted]w[redacted]d (Estimated Date of Delivery: 07/11/21). [name] and her partner had a prior miscarriage at 53 weeks' gestation. They named their son Amanda Barters   [name] denied exposure to environmental toxins or chemical agents. She denied the use of alcohol, tobacco or street drugs. She reported taking prenatal vitamins, aspirin, and Zyrtec. She denied significant viral illnesses, fevers, and bleeding during the course of her pregnancy. Her medical and surgical histories were noncontributory.  Family History  A three generation pedigree was drafted and reviewed. The family history is remarkable for the following:  - Mr. Amanda Vincent had a son from a prior relationship who was born with a congenital heart defect (CHD). Unfortunately, this child died at birth. Mr. Amanda Vincent did not have further details on the type of CHD his son had. See Discussion section for more details.  The remaining family histories were reviewed and found to be noncontributory for birth defects, intellectual disability, recurrent pregnancy loss, and known genetic conditions. [name] had limited information about her paternal family history; thus, risk assessment was limited.  The patient's ancestry is [place]. The father of the pregnancy's ancestry is [place] and Native American ([place]). Consanguinity was  denied. The couple was uncertain if they have any Ashkenazi Jewish ancestry. Pedigree will be scanned under Media.  Discussion  Family history of congenital heart defect:  [name] was referred for genetic counseling to discuss her partner's history of a previous child with a congenital heart defect (CHD). Mr. Amanda Vincent had a son from a prior relationship who was born with a CHD. He did not have any further details about this CHD. However, he did report that his son died at birth.    We reviewed that CHDs can be isolated or a feature of an underlying genetic condition. CHDs are most often multifactorial in etiology, but can also result from chromosome aberrations, single gene conditions, or teratogenic exposures. CHDs occur in ~0.5% of the general population. If Mr. Woodson's son's CHD was nonsyndromic, the risk of recurrence for a CHD in the current fetus would likely be ~1% (given that his son is a half sibling to the fetus). If however, there was an underlying genetic condition that caused his son's CHD, the chance of recurrence for the fetus could be higher, possibly up to 50% depending on the inheritance of the condition. The couple understands that without knowing the underlying etiology of Mr. Woodson's son's CHD, risk assessment for the current pregnancy is limited.  Hemoglobin C trait:  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 We reviewed that hemoglobin C is a variant form of hemoglobin caused by a specific mutation in the HBB gene. The HBB gene encodes for a protein called beta-globin, which is a subunit of hemoglobin. Hemoglobin within red blood cells binds to oxygen molecules in the lungs and delivers oxygen to tissues throughout the body. There are many different types of  hemoglobin, with hemoglobin A being the most common form. Mutations in the HBB gene cause variant forms of hemoglobin to be produced, such as hemoglobin C and hemoglobin S.  Individuals with hemoglobin C trait are carriers for hemoglobin C disease (HbC disease), whereas individuals with hemoglobin S trait are carriers for sickle cell disease. 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   Individuals with HbC disease have red blood cells that contain mostly hemoglobin C. Too much hemoglobin C can reduce the number and size of red blood cells in the body, causing mild anemia.  As a result, individuals with HbC disease may experience symptoms such as weakness, fatigue, and splenomegaly. Carriers of the condition typically do not show any symptoms. HbC disease is inherited in an autosomal recessive pattern, where both parents must carry hemoglobin C trait to be at risk of having an affected child. If [name]'s partner were also a carrier of HbC disease, the couple would have a 1 in 4 (25%) chance of having a child with HbC disease with each of their pregnancies.    It is also possible that [name]'s partner could carry another variant form of hemoglobin, such as hemoglobin S. If he did, the couple would have a 1 in 4 (25%) chance of having a child with hemoglobin [place] disease (HbSC disease). Individuals with HbSC disease have red blood cells that contain both hemoglobin S and hemoglobin C. These variant forms of hemoglobin can cause red blood cells to become rigid and sickle, blocking small blood vessels and making it difficult for the red blood cells to deliver oxygen to the body's tissues. This can cause severe pain and organ damage, just as we see in individuals with sickle cell disease. Individuals with HbSC disease are at risk of the same complications as those associated with sickle cell disease, such as pain crises, acute chest syndrome, infections, asplenia, and strokes; however, these complications may occur at a lesser frequency.  Finally, [name]'s partner may have a different variant in the HBB  gene (such as beta-thalassemia) that could have other clinical implications for the couple's children. The couple was counseled that it is recommended that [name]'s partner undergo carrier screening for the HBB gene to refine the risks for the current fetus and the couple's future children to be affected by a hemoglobinopathy. Mr. Amanda Vincent expressed interest in partner carrier screening.   Aneuploidy screening:  Given that [name] has not yet had screening for chromosomal aneuploidies during this pregnancy, we reviewed noninvasive prenatal screening (NIPS) as an available option. Specifically, we discussed that NIPS analyzes cell free DNA originating from the placenta that is found in the maternal blood circulation during pregnancy. This test is not diagnostic for chromosome conditions, but can provide information regarding the presence or absence of extra DNA for chromosomes 13, 18, 21, and the sex chromosomes. Thus, it would not identify or rule out all fetal aneuploidy or all genetic conditions. The reported detection rate is 91-99% for trisomies 21, 18, 13, and sex chromosome aneuploidies. The false positive rate is reported to be less than 0.1% for any of these conditions. [name] indicated that she is interested in undergoing NIPS.  Other carrier screening:  Per ACOG recommendation, carrier screening for cystic fibrosis (CF) and spinal muscular atrophy (SMA) was discussed including information about the conditions, rationale for testing, autosomal recessive inheritance, and the option of prenatal diagnosis. [name] previously had negative  carrier screening for 32 of the most common mutations in the CFTR gene associated with CF. This decreased her chance of being a carrier for CF to 1 in 186. I offered additional carrier screening for SMA, which [name] accepted at this time. [name] was informed that select hemoglobinopathies, CF, and SMA are all included on  [place]'s newborn screen.   Diagnostic testing:  [name] was also counseled regarding the option of diagnostic testing via chorionic villus sampling (CVS) or amniocentesis. We discussed the technical aspects of each procedure and quoted up to a 1 in 500 (0.2%) risk for spontaneous pregnancy loss or other adverse pregnancy outcomes as a result of either procedure. Cultured cells from either a placental or amniotic fluid sample allow for the visualization of a fetal karyotype, which can detect >99% of large chromosomal aberrations. Chromosomal microarray can also be performed to identify smaller deletions or duplications of fetal chromosomal material. CVS or amniocentesis could also be performed to assess whether the baby is affected by hemoglobinopathies. After careful consideration, [name] declined diagnostic testing at this time. She understands that diagnostic testing is available at any point through the end of pregnancy and that she may opt to undergo a procedure at a later date should she change her mind.   Plan:  [name] opted to have a sample drawn for MaterniT21 NIPS and SMA carrier screening today. We discussed that it is recommended that we wait to order partner carrier screening until [name]'s SMA carrier screening results are back. If she is found to be a carrier for that condition, partner carrier screening for Mr. Amanda Vincent would be recommended both for SMA and HBB-related hemoglobinopathies. In the meantime, the couple requested that I perform a benefits investigation to determine Mr. Woodson's estimated out of pocket cost for partner carrier screening. I will complete this and contact the couple with an estimate. I can help to facilitate partner carrier screening from there.  Results from NIPS will take approximately 1 week to be returned. Results from SMA carrier screening will take 2-3 weeks to be returned. I will call [name] once her results  become available.  I offered the couple support resources for individuals who have experienced fetal loss, which they declined at this time. They both learned skills to assist in coping with the loss of their son and feel that they are currently in a positive place. I encouraged them to contact me if they ever feel that they could benefit from support resources in the future.   I counseled [name] regarding the above risks and available options. The approximate face-to-face time with the genetic counselor was 50 minutes.  In summary:  Reviewed family history concerns  FOB had son from prior relationship with congenital heart defect (details unknown)  Discussed congenital heart defects, including information about possible causes and recurrence risks  Risk for congential heart defect in current fetus likely greatly than population risk given family history  Exact recurrence risk depends on underlying cause of FOB's son's heart defect  Discussed carrier screening results  Patient carries hemoglobin C trait  Negative carrier screening for cystic fibrosis (1 in 186 residual risk)  Had sample drawn for spinal muscular atrophy carrier screening. We will follow results  Will facilitate partner carrier screening for hemoglobinopathies (and SMA if applicable) once patient's SMA results have been returned  Offered additional testing and screening  Had sample drawn for MaterniT21 NIPS. We will follow results  Declined diagnostic testing  Amanda Manis, MS, [facility] Genetic Counselor

## 2021-01-11 ENCOUNTER — Other Ambulatory Visit: Payer: Self-pay | Admitting: Obstetrics

## 2021-01-11 ENCOUNTER — Encounter: Payer: Self-pay | Admitting: Obstetrics and Gynecology

## 2021-01-11 ENCOUNTER — Ambulatory Visit (INDEPENDENT_AMBULATORY_CARE_PROVIDER_SITE_OTHER): Payer: Medicaid Other | Admitting: Obstetrics and Gynecology

## 2021-01-11 VITALS — BP 101/66 | HR 76 | Wt 135.5 lb

## 2021-01-11 DIAGNOSIS — J4540 Moderate persistent asthma, uncomplicated: Secondary | ICD-10-CM | POA: Diagnosis not present

## 2021-01-11 DIAGNOSIS — O0990 Supervision of high risk pregnancy, unspecified, unspecified trimester: Secondary | ICD-10-CM | POA: Insufficient documentation

## 2021-01-11 DIAGNOSIS — Z3A14 14 weeks gestation of pregnancy: Secondary | ICD-10-CM

## 2021-01-11 DIAGNOSIS — Z363 Encounter for antenatal screening for malformations: Secondary | ICD-10-CM

## 2021-01-11 DIAGNOSIS — O09291 Supervision of pregnancy with other poor reproductive or obstetric history, first trimester: Secondary | ICD-10-CM | POA: Diagnosis not present

## 2021-01-11 MED ORDER — BLOOD PRESSURE KIT DEVI: 1.0000 | 0 refills | Status: DC

## 2021-01-11 NOTE — Progress Notes (Signed)
  Subjective:    Amanda Vincent is a G2P0010 [redacted]w[redacted]d being seen today for her first obstetrical visit.  Her obstetrical history is significant for history of miscarraige at 18 weeks. Patient does intend to breast feed. Pregnancy history fully reviewed.  Patient reports no complaints. Feeling very positive and excited about this pregnancy. FOB in room as well and very excited.   Asthma - had asthma exacerbation a few months ago and was placed on BID ICS/LABA. Not taking anything daily anymore. Takes rescue inhaler once a week at most.   Taking PNV and aspirin daily.    Vitals:   01/11/21 1511  BP: 101/66  Pulse: 76  Weight: 135 lb 8 oz (61.5 kg)    HISTORY: OB History  Gravida Para Term Preterm AB Living  2 0 0 0 1 0  SAB IAB Ectopic Multiple Live Births  1 0 0 0 0    # Outcome Date GA Lbr Len/2nd Weight Sex Delivery Anes PTL Lv  2 Current           1 SAB 06/21/20 [redacted]w[redacted]d            Complications: Preterm delivery   Past Medical History:  Diagnosis Date  . Acne   . Anxiety   . Asthma    Past Surgical History:  Procedure Laterality Date  . NO PAST SURGERIES     Family History  Problem Relation Age of Onset  . Healthy Mother   . Healthy Father      Exam     Skin: normal coloration and turgor, no rashes    Neurologic: oriented, normal   Extremities: normal strength, tone, and muscle mass   HEENT PERRLA   Mouth/Teeth mucous membranes moist, pharynx normal without lesions   Neck supple   Cardiovascular: regular rate and rhythm   Respiratory:  appears well, vitals normal, no respiratory distress, acyanotic, normal RR, ear and throat exam is normal, neck free of mass or lymphadenopathy, chest clear, no wheezing, crepitations, rhonchi, normal symmetric air entry   Abdomen: soft, non-tender; bowel sounds normal; no masses,  no organomegaly   Urinary: urethral meatus normal      Assessment:    Pregnancy: G2P0010 Patient Active Problem List   Diagnosis Date  Noted  . Supervision of high risk pregnancy, antepartum 01/11/2021  . Asthma   . Preterm premature rupture of membranes (PPROM) with unknown onset of labor 06/21/2020  . Anhydramnios 06/17/2020  . Contraceptive management 08/05/2018  . Acne vulgaris 04/29/2017        Plan:    Supervision of high risk pregnancy Welcomed to practice! Reviewed prior medical and obstetrical history. Patient is not a 17-P candidate given miscarraige < 20 weeks and normal CL on [place]. Discussed with patient and she is aware.  Prenatal labs already obtained. Reviewed.  Prenatal vitamins. Problem list reviewed and updated. Genetic Screening discussed Integrated Screen: ordered.  Ultrasound discussed; fetal survey: requested.  Follow up in 4 weeks.   Gita Kudo 01/11/2021

## 2021-01-11 NOTE — Patient Instructions (Signed)

## 2021-01-12 ENCOUNTER — Encounter: Payer: Self-pay | Admitting: *Deleted

## 2021-01-12 DIAGNOSIS — O0990 Supervision of high risk pregnancy, unspecified, unspecified trimester: Secondary | ICD-10-CM | POA: Diagnosis not present

## 2021-01-14 ENCOUNTER — Telehealth: Payer: Self-pay | Admitting: Genetic Counselor

## 2021-01-14 NOTE — Telephone Encounter (Signed)
I called Amanda Vincent to discuss her negative noninvasive prenatal screening (NIPS) result. Specifically, Amanda Vincent had MaterniT21 testing through [facility]. These negative results demonstrated an expected representation of chromosome 21, 18, 13, and sex chromosome material, greatly reducing the likelihood of trisomies 2, 51, or 68 and sex chromosome aneuploidies for the pregnancy. Amanda Vincent requested NOT to know about the expected fetal sex as she will be having a gender reveal party. Per her request, we will put the expected fetal sex in an envelope and she will pick it up at our front desk on Tuesday. She knows not to look in MyChart as the expected fetal sex is written on her testing report.  NIPS analyzes placental DNA in maternal circulation. NIPS is considered to be highly specific and sensitive, but is not considered to be a diagnostic test. We reviewed that this testing identifies 91-99% of pregnancies with trisomies 25, 26, and 37, as well as sex chromosome aneuploidies, but may miss some cases of these conditions and does not test for all genetic conditions. Diagnostic testing via amniocentesis is available from 16 weeks' gestation should Amanda Vincent be interested in confirming this result.   I reminded Amanda Vincent that her spinal muscular atrophy carrier screening results are still pending. I will call her once these become available, likely within another 2-3 weeks. Amanda Vincent confirmed that she had no further questions at this time.  Gershon Crane, MS, [facility] Genetic Counselor

## 2021-01-18 ENCOUNTER — Ambulatory Visit: Payer: Self-pay

## 2021-01-18 LAB — MATERNIT21 PLUS CORE+SCA
Fetal Fraction: 11
Monosomy X (Turner Syndrome): NOT DETECTED
Result (T21): NEGATIVE
Trisomy 13 (Patau syndrome): NEGATIVE
Trisomy 18 (Edwards syndrome): NEGATIVE
Trisomy 21 (Down syndrome): NEGATIVE
XXX (Triple X Syndrome): NOT DETECTED
XXY (Klinefelter Syndrome): NOT DETECTED
XYY (Jacobs Syndrome): NOT DETECTED

## 2021-01-18 LAB — SMN1 COPY NUMBER ANALYSIS (SMA CARRIER SCREENING)

## 2021-01-26 ENCOUNTER — Telehealth: Payer: Self-pay | Admitting: Genetic Counselor

## 2021-01-26 ENCOUNTER — Encounter: Payer: Self-pay | Admitting: Genetic Counselor

## 2021-01-26 NOTE — Telephone Encounter (Signed)
I called Ms. Ruff-Biggs to discuss her negative carrier screening results for spinal muscular atrophy (SMA). 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 This result reduces, but does not eliminate her risk to be a carrier for SMA. Based on the sensitivity of the screen, there is a 1 in 4200 residual risk for Ms. Ruff-Biggs to be a carrier for SMA. Partner carrier screening is not recommended.  I also informed Ms. Ruff-Biggs that I was unable to complete the benefits investigation for partner carrier screening of the HBB gene as her partner Ivin Booty Woodson)'s insurance plan is inactive. If he has an updated insurance card, I can attempt to run the benefits investigation again. Otherwise, if he no longer has active insurance coverage, he may qualify for free testing through the laboratory [facility]'s Patient Assistance Program. We also discussed that [facility] offers a self-pay price of $250 for carrier screening. Ms. Belva Bertin will discuss this with her partner and contact me with their decision on how to proceed with partner carrier screening from here. She confirmed that she had no further questions at this time.  Gershon Crane, MS, [facility] Genetic Counselor

## 2021-02-10 ENCOUNTER — Ambulatory Visit (INDEPENDENT_AMBULATORY_CARE_PROVIDER_SITE_OTHER): Payer: Medicaid Other | Admitting: Obstetrics & Gynecology

## 2021-02-10 ENCOUNTER — Other Ambulatory Visit: Payer: Self-pay

## 2021-02-10 VITALS — BP 109/58 | HR 74 | Wt 136.0 lb

## 2021-02-10 DIAGNOSIS — O0990 Supervision of high risk pregnancy, unspecified, unspecified trimester: Secondary | ICD-10-CM

## 2021-02-10 DIAGNOSIS — O09292 Supervision of pregnancy with other poor reproductive or obstetric history, second trimester: Secondary | ICD-10-CM

## 2021-02-10 NOTE — Patient Instructions (Signed)
Second Trimester of Pregnancy °The second trimester of pregnancy is from week 13 through week 27. This is also called months 4 through 6 of pregnancy. This is often the time when you feel your best. °During the second trimester: °Morning sickness is less or has stopped. °You may have more energy. °You may feel hungry more often. °At this time, your unborn baby (fetus) is growing very fast. At the end of the sixth month, the unborn baby may be up to 12 inches long and weigh about 1½ pounds. You will likely start to feel the baby move between 16 and 20 weeks of pregnancy. °Body changes during your second trimester °Your body continues to go through many changes during this time. The changes vary and generally return to normal after the baby is born. °Physical changes °You will gain more weight. °You may start to get stretch marks on your hips, belly (abdomen), and breasts. °Your breasts will grow and may hurt. °Dark spots or blotches may develop on your face. °A dark line from your belly button to the pubic area (linea nigra) may appear. °You may have changes in your hair. °Health changes °You may have headaches. °You may have heartburn. °You may have trouble pooping (constipation). °You may have hemorrhoids or swollen, bulging veins (varicose veins). °Your gums may bleed. °You may pee (urinate) more often. °You may have back pain. °Follow these instructions at home: °Medicines °Take over-the-counter and prescription medicines only as told by your doctor. Some medicines are not safe during pregnancy. °Take a prenatal vitamin that contains at least 600 micrograms (mcg) of folic acid. °Eating and drinking °Eat healthy meals that include: °Fresh fruits and vegetables. °Whole grains. °Good sources of protein, such as meat, eggs, or tofu. °Low-fat dairy products. °Avoid raw meat and unpasteurized juice, milk, and cheese. °You may need to take these actions to prevent or treat trouble pooping: °Drink enough fluids to keep  your pee (urine) pale yellow. °Eat foods that are high in fiber. These include beans, whole grains, and fresh fruits and vegetables. °Limit foods that are high in fat and sugar. These include fried or sweet foods. °Activity °Exercise only as told by your doctor. Most people can do their usual exercise during pregnancy. Try to exercise for 30 minutes at least 5 days a week. °Stop exercising if you have pain or cramps in your belly or lower back. °Do not exercise if it is too hot or too humid, or if you are in a place of great height (high altitude). °Avoid heavy lifting. °If you choose to, you may have sex unless your doctor tells you not to. °Relieving pain and discomfort °Wear a good support bra if your breasts are sore. °Take warm water baths (sitz baths) to soothe pain or discomfort caused by hemorrhoids. Use hemorrhoid cream if your doctor approves. °Rest with your legs raised (elevated) if you have leg cramps or low back pain. °If you develop bulging veins in your legs: °Wear support hose as told by your doctor. °Raise your feet for 15 minutes, 3-4 times a day. °Limit salt in your food. °Safety °Wear your seat belt at all times when you are in a car. °Talk with your doctor if someone is hurting you or yelling at you a lot. °Lifestyle °Do not use hot tubs, steam rooms, or saunas. °Do not douche. Do not use tampons or scented sanitary pads. °Avoid cat litter boxes and soil used by cats. These carry germs that can harm your baby and   can cause a loss of your baby by miscarriage or stillbirth. °Do not use herbal medicines, illegal drugs, or medicines that are not approved by your doctor. Do not drink alcohol. °Do not smoke or use any products that contain nicotine or tobacco. If you need help quitting, ask your doctor. °General instructions °Keep all follow-up visits. This is important. °Ask your doctor about local prenatal classes. °Ask your doctor about the right foods to eat or for help finding a  counselor. °Where to find more information °American Pregnancy Association: americanpregnancy.org °American College of Obstetricians and Gynecologists: www.acog.org °Office on Women's Health: womenshealth.gov/pregnancy °Contact a doctor if: °You have a headache that does not go away when you take medicine. °You have changes in how you see, or you see spots in front of your eyes. °You have mild cramps, pressure, or pain in your lower belly. °You continue to feel like you may vomit (nauseous), you vomit, or you have watery poop (diarrhea). °You have bad-smelling fluid coming from your vagina. °You have pain when you pee or your pee smells bad. °You have very bad swelling of your face, hands, ankles, feet, or legs. °You have a fever. °Get help right away if: °You are leaking fluid from your vagina. °You have spotting or bleeding from your vagina. °You have very bad belly cramping or pain. °You have trouble breathing. °You have chest pain. °You faint. °You have not felt your baby move for the time period told by your doctor. °You have new or increased pain, swelling, or redness in an arm or leg. °Summary °The second trimester of pregnancy is from week 13 through week 27 (months 4 through 6). °Eat healthy meals. °Exercise as told by your doctor. Most people can do their usual exercise during pregnancy. °Do not use herbal medicines, illegal drugs, or medicines that are not approved by your doctor. Do not drink alcohol. °Call your doctor if you get sick or if you notice anything unusual about your pregnancy. °This information is not intended to replace advice given to you by your health care provider. Make sure you discuss any questions you have with your health care provider. °Document Revised: 01/14/2020 Document Reviewed: 11/20/2019 °Elsevier Patient Education © 2022 [employer] ° °

## 2021-02-10 NOTE — Progress Notes (Signed)
   PRENATAL VISIT NOTE  Subjective:  Amanda Vincent is a 20 y.o. G2P0010 at [redacted]w[redacted]d being seen today for ongoing prenatal care.  She is currently monitored for the following issues for this high-risk pregnancy and has Acne vulgaris; Contraceptive management; Asthma; and Supervision of high risk pregnancy, antepartum on their problem list.  Patient reports no complaints.  Contractions: Not present. Vag. Bleeding: None.  Movement: Present. Denies leaking of fluid.   The following portions of the patient's history were reviewed and updated as appropriate: allergies, current medications, past family history, past medical history, past social history, past surgical history and problem list.   Objective:   Vitals:   02/10/21 1115  BP: (!) 109/58  Pulse: 74  Weight: 136 lb (61.7 kg)    Fetal Status: Fetal Heart Rate (bpm): 141   Movement: Present     General:  Alert, oriented and cooperative. Patient is in no acute distress.  Skin: Skin is warm and dry. No rash noted.   Cardiovascular: Normal heart rate noted  Respiratory: Normal respiratory effort, no problems with respiration noted  Abdomen: Soft, gravid, appropriate for gestational age.  Pain/Pressure: Present     Pelvic: Cervical exam deferred        Extremities: Normal range of motion.  Edema: None  Mental Status: Normal mood and affect. Normal behavior. Normal judgment and thought content.   Assessment and Plan:  Pregnancy: G2P0010 at [redacted]w[redacted]d 1. Supervision of high risk pregnancy, antepartum Has [place] f/u in 4 days  2. Current pregnancy with history of spontaneous abortion during second trimester of prior pregnancy Cervical length at [place]. No sx today  Preterm labor symptoms and general obstetric precautions including but not limited to vaginal bleeding, contractions, leaking of fluid and fetal movement were reviewed in detail with the patient. Please refer to After Visit Summary for other counseling recommendations.   Return in  about 4 weeks (around 03/10/2021).  Future Appointments  Date Time Provider Department Center  02/14/2021  1:15 PM [facility] NURSE [facility]  02/14/2021  1:30 PM [facility]    Scheryl Darter, MD

## 2021-02-12 ENCOUNTER — Observation Stay (HOSPITAL_COMMUNITY)
Admission: AD | Admit: 2021-02-12 | Discharge: 2021-02-12 | Disposition: A | Discharge status: Home / Self Care | Payer: Medicaid Other | Attending: Obstetrics & Gynecology | Admitting: Obstetrics & Gynecology

## 2021-02-12 ENCOUNTER — Other Ambulatory Visit: Payer: Self-pay

## 2021-02-12 ENCOUNTER — Inpatient Hospital Stay (EMERGENCY_DEPARTMENT_HOSPITAL)
Admission: AD | Admit: 2021-02-12 | Discharge: 2021-02-12 | Disposition: A | Discharge status: Home / Self Care | Payer: Medicaid Other | Source: Home / Self Care | Attending: Obstetrics & Gynecology | Admitting: Obstetrics & Gynecology

## 2021-02-12 DIAGNOSIS — Z3A18 18 weeks gestation of pregnancy: Secondary | ICD-10-CM

## 2021-02-12 DIAGNOSIS — Z20822 Contact with and (suspected) exposure to covid-19: Secondary | ICD-10-CM | POA: Insufficient documentation

## 2021-02-12 DIAGNOSIS — J45909 Unspecified asthma, uncomplicated: Secondary | ICD-10-CM | POA: Diagnosis not present

## 2021-02-12 DIAGNOSIS — O42919 Preterm premature rupture of membranes, unspecified as to length of time between rupture and onset of labor, unspecified trimester: Secondary | ICD-10-CM

## 2021-02-12 DIAGNOSIS — O411220 Chorioamnionitis, second trimester, not applicable or unspecified: Secondary | ICD-10-CM | POA: Diagnosis not present

## 2021-02-12 DIAGNOSIS — O99512 Diseases of the respiratory system complicating pregnancy, second trimester: Secondary | ICD-10-CM | POA: Diagnosis not present

## 2021-02-12 DIAGNOSIS — O42912 Preterm premature rupture of membranes, unspecified as to length of time between rupture and onset of labor, second trimester: Secondary | ICD-10-CM

## 2021-02-12 DIAGNOSIS — O09212 Supervision of pregnancy with history of pre-term labor, second trimester: Secondary | ICD-10-CM

## 2021-02-12 DIAGNOSIS — Z3A19 19 weeks gestation of pregnancy: Secondary | ICD-10-CM | POA: Diagnosis not present

## 2021-02-12 DIAGNOSIS — Z87891 Personal history of nicotine dependence: Secondary | ICD-10-CM | POA: Insufficient documentation

## 2021-02-12 DIAGNOSIS — Z91010 Allergy to peanuts: Secondary | ICD-10-CM | POA: Insufficient documentation

## 2021-02-12 DIAGNOSIS — O42013 Preterm premature rupture of membranes, onset of labor within 24 hours of rupture, third trimester: Secondary | ICD-10-CM

## 2021-02-12 LAB — TYPE AND SCREEN
ABO/RH(D): O POS
Antibody Screen: NEGATIVE

## 2021-02-12 LAB — CBC WITH DIFFERENTIAL/PLATELET
Abs Immature Granulocytes: 0.08 10*3/uL — ABNORMAL HIGH (ref 0.00–0.07)
Basophils Absolute: 0.1 10*3/uL (ref 0.0–0.1)
Basophils Relative: 0 %
Eosinophils Absolute: 0.6 10*3/uL — ABNORMAL HIGH (ref 0.0–0.5)
Eosinophils Relative: 5 %
HCT: 35.8 % — ABNORMAL LOW (ref 36.0–46.0)
Hemoglobin: 13.2 g/dL (ref 12.0–15.0)
Immature Granulocytes: 1 %
Lymphocytes Relative: 13 %
Lymphs Abs: 1.8 10*3/uL (ref 0.7–4.0)
MCH: 31 pg (ref 26.0–34.0)
MCHC: 36.9 g/dL — ABNORMAL HIGH (ref 30.0–36.0)
MCV: 84 fL (ref 80.0–100.0)
Monocytes Absolute: 0.6 10*3/uL (ref 0.1–1.0)
Monocytes Relative: 4 %
Neutro Abs: 10.2 10*3/uL — ABNORMAL HIGH (ref 1.7–7.7)
Neutrophils Relative %: 77 %
Platelets: 212 10*3/uL (ref 150–400)
RBC: 4.26 MIL/uL (ref 3.87–5.11)
RDW: 12.4 % (ref 11.5–15.5)
WBC: 13.4 10*3/uL — ABNORMAL HIGH (ref 4.0–10.5)
nRBC: 0 % (ref 0.0–0.2)

## 2021-02-12 LAB — SARS CORONAVIRUS 2 (TAT 6-24 HRS): SARS Coronavirus 2: NEGATIVE

## 2021-02-12 MED ORDER — IBUPROFEN 600 MG PO TABS
600.0000 mg | ORAL_TABLET | Freq: Four times a day (QID) | ORAL | Status: DC
  Administered 2021-02-12: 600 mg via ORAL
  Filled 2021-02-12: qty 1

## 2021-02-12 MED ORDER — PRENATAL MULTIVITAMIN CH: 1.0000 | ORAL_TABLET | Freq: Every day | ORAL | Status: DC

## 2021-02-12 MED ORDER — BENZOCAINE-MENTHOL 20-0.5 % EX AERO: 1.0000 "application " | INHALATION_SPRAY | CUTANEOUS | Status: DC | PRN

## 2021-02-12 MED ORDER — ONDANSETRON HCL 4 MG/2ML IJ SOLN
4.0000 mg | Freq: Four times a day (QID) | INTRAMUSCULAR | Status: DC | PRN
  Administered 2021-02-12: 4 mg via INTRAVENOUS
  Filled 2021-02-12: qty 2

## 2021-02-12 MED ORDER — IBUPROFEN 600 MG PO TABS: 600.0000 mg | ORAL_TABLET | Freq: Four times a day (QID) | ORAL | 0 refills | Status: DC | PRN

## 2021-02-12 MED ORDER — LACTATED RINGERS IV SOLN: INTRAVENOUS | Status: DC

## 2021-02-12 MED ORDER — FENTANYL CITRATE (PF) 100 MCG/2ML IJ SOLN
50.0000 ug | INTRAMUSCULAR | Status: DC | PRN
Start: 2021-02-12 — End: 2021-02-12
  Administered 2021-02-12 (×2): 50 ug via INTRAVENOUS
  Filled 2021-02-12 (×2): qty 2

## 2021-02-12 MED ORDER — CALCIUM CARBONATE ANTACID 500 MG PO CHEW: 2.0000 | CHEWABLE_TABLET | ORAL | Status: DC | PRN

## 2021-02-12 MED ORDER — DIPHENHYDRAMINE HCL 25 MG PO CAPS: 25.0000 mg | ORAL_CAPSULE | Freq: Four times a day (QID) | ORAL | Status: DC | PRN

## 2021-02-12 MED ORDER — DOCUSATE SODIUM 100 MG PO CAPS: 100.0000 mg | ORAL_CAPSULE | Freq: Every day | ORAL | Status: DC

## 2021-02-12 MED ORDER — MISOPROSTOL 200 MCG PO TABS
400.0000 ug | ORAL_TABLET | ORAL | Status: DC
  Administered 2021-02-12: 400 ug via VAGINAL
  Filled 2021-02-12: qty 2

## 2021-02-12 MED ORDER — ACETAMINOPHEN 325 MG PO TABS: 650.0000 mg | ORAL_TABLET | ORAL | Status: DC | PRN

## 2021-02-12 MED ORDER — ZOLPIDEM TARTRATE 5 MG PO TABS: 5.0000 mg | ORAL_TABLET | Freq: Every evening | ORAL | Status: DC | PRN

## 2021-02-12 MED ORDER — MISOPROSTOL 200 MCG PO TABS
ORAL_TABLET | ORAL | Status: AC
  Filled 2021-02-12: qty 2

## 2021-02-12 MED ORDER — MISOPROSTOL 200 MCG PO TABS
400.0000 ug | ORAL_TABLET | Freq: Four times a day (QID) | ORAL | Status: DC
  Administered 2021-02-12: 400 ug via VAGINAL

## 2021-02-12 MED ORDER — OXYTOCIN-SODIUM CHLORIDE 30-0.9 UT/500ML-% IV SOLN
INTRAVENOUS | Status: AC
  Filled 2021-02-12: qty 500

## 2021-02-12 NOTE — H&P (Addendum)
OBSTETRIC ADMISSION HISTORY AND PHYSICAL  Amanda Vincent is a 20 y.o. female G2P0010 with IUP at [redacted]w[redacted]d admitted due to PPROM.  This am had episode of leaking fluid down to her legs.  Initially, when she first presented she was not in pain; however she now reports considerable pelvic and abdominal pain.  Pain intermittent, every 47min or so.  Rates her pain 6/10, desires pain medication.  Still leaking fluid.  No vaginal bleeding.  No other acute complaints  She received her prenatal care at [facility]   Dating: By LMP --->  Estimated Date of Delivery: 07/11/21 [place] completed 01/10/21: confirmed viability/dating  Prenatal History/Complications:  -h/o PPROM @ 18wk with delivery  Past Medical History: Past Medical History:  Diagnosis Date   Acne    Anxiety    Asthma     Past Surgical History: Past Surgical History:  Procedure Laterality Date   NO PAST SURGERIES      Obstetrical History: OB History     Gravida  2   Para  0   Term  0   Preterm  0   AB  1   Living  0      SAB  1   IAB  0   Ectopic  0   Multiple  0   Live Births  0           Social History Social History   Socioeconomic History   Marital status: Single    Spouse name: Not on file   Number of children: Not on file   Years of education: Not on file   Highest education level: Not on file  Occupational History   Not on file  Tobacco Use   Smoking status: Former    Pack years: 0.00    Types: Cigars   Smokeless tobacco: Never   Tobacco comments:    Cigars for marijuana  Vaping Use   Vaping Use: Never used  Substance and Sexual Activity   Alcohol use: Not Currently   Drug use: Not Currently    Types: Marijuana   Sexual activity: Yes  Other Topics Concern   Not on file  Social History Narrative   Not on file   Social Determinants of Health   Financial Resource Strain: Not on file  Food Insecurity: Food Insecurity Present   Worried About [place] in the Last Year:  Sometimes true   Ran Out of Food in the Last Year: Sometimes true  Transportation Needs: Unmet Transportation Needs   Lack of Transportation (Medical): Yes   Lack of Transportation (Non-Medical): Yes  Physical Activity: Not on file  Stress: Not on file  Social Connections: Not on file    Family History: Family History  Problem Relation Age of Onset   Healthy Mother    Healthy Father     Allergies: Allergies  Allergen Reactions   Corn-Containing Products    Dog Epithelium    Mold Extract [Trichophyton]    Peanut-Containing Drug Products    Sesame Seed (Diagnostic)     Medications Prior to Admission  Medication Sig Dispense Refill Last Dose   albuterol (VENTOLIN HFA) 108 (90 Base) MCG/ACT inhaler Inhale 1-2 puffs into the lungs every 4 (four) hours as needed for wheezing or shortness of breath. 1 each 2    benzoyl peroxide (BENZOYL PEROXIDE) 5 % external liquid Apply topically 2 (two) times daily. 142 g 3    Blood Pressure Monitoring (BLOOD PRESSURE KIT) DEVI 1 each by Does  not apply route once a week. 1 each 0    cetirizine (ZYRTEC) 10 MG tablet Take 10 mg by mouth daily.      Clindamycin-Benzoyl Per, Refr, gel APPLY 1 EACH TOPICALLY 2 (TWO) TIMES DAILY. 45 g 1    FLOVENT HFA 44 MCG/ACT inhaler Inhale 2 puffs into the lungs 2 (two) times daily.      Fluticasone-Salmeterol (ADVAIR DISKUS) 100-50 MCG/DOSE AEPB Inhale 1 puff into the lungs daily. 60 each 1    Prenatal Vit-Fe Fumarate-FA (M-NATAL PLUS) 27-1 MG TABS TAKE 1 TABLET BY MOUTH EVERY DAY 30 tablet 12      Review of Systems   All systems reviewed and negative except as stated in HPI  Blood pressure 128/73, pulse 76, temperature 99.4 F (37.4 C), temperature source Oral, resp. rate 19, last menstrual period 10/04/2020, SpO2 100 %. General appearance: alert and moderate distress Lungs: clear to auscultation bilaterally Heart: regular rate and rhythm Abdomen: soft, non-tender; bowel sounds normal Pelvic:  deferred Extremities: Homans sign is negative, no sign of DVT   Prenatal labs: ABO, Rh: O/Positive/-- (05/05 0000) Antibody: Negative (05/05 0000) Rubella:   RPR: Nonreactive (05/05 0000)  HBsAg:    HIV:    GBS:    Genetic screening  NIPS wnl   Assessment/Plan:  Amanda Vincent is a 20 y.o. G2P0010 at [redacted]w[redacted]d admitted for PPROM  -Discussed management options including misoprostol induction vs expectant management.  Pt desires expectant management. -Pain: Discussed options, declined epidural, IV Fentanyl ordered -LR @ 125cc/hr -currently NPO -CBC, T&S to be obtained -Chaplain consulted  ADDENDUM: Called to bedside as patient reported feeling bag at vaginal level.  Pelvic exam performed- unable to palpate cervix.  Large bag present with concern for immediate delivery.  Pt advised to push if desired.  She reported feeling pressure, large gush of yellow-tinged fluid.  Reassured pt that this is part of the process.  Fetal delivery did not yet occur.  Again reviewed management options- at this time pt does desire to proceed with misoprostol induction.    Janyth Pupa, DO Attending [place], [facility] for [employer], [place]

## 2021-02-12 NOTE — MAU Note (Signed)
Pt reports to mau with c/o 1 episode of LOF this morning when waking up.  Pt reports she has not leaked again since then.  Denies pain or bleeding.  Denies vag dc, itching or odor.  Denies recent intercourse.  [id number]

## 2021-02-12 NOTE — Discharge Summary (Signed)
Postpartum Discharge Summary  Date of Service updated- 6/25     Patient Name: Amanda Vincent DOB: December 10, 2000 MRN: [id number]  Date of admission: 02/12/2021 Delivery date:02/12/2021  Delivering provider: Janyth Pupa  Date of discharge: 02/12/2021  Admitting diagnosis: Preterm premature rupture of membranes (PPROM) with unknown onset of labor [O42.919] Intrauterine pregnancy: [redacted]w[redacted]d     Secondary diagnosis:  Active Problems:   Preterm premature rupture of membranes (PPROM) with unknown onset of labor  Additional problems: none    Discharge diagnosis:  Preterm previable delivery                                               Post partum procedures: none Augmentation: Cytotec Complications: None  Hospital course: Onset of Labor With Vaginal Delivery      20 y.o. yo G2P0010 at [redacted]w[redacted]d was admitted in Active Labor on 02/12/2021. Patient had an uncomplicated labor course as follows:  Membrane Rupture Time/Date: 7:00 AM ,02/12/2021   Delivery Method:Vaginal, Breech  Episiotomy: None  Lacerations:    Patient had an uncomplicated postpartum course.  She is ambulating, tolerating a regular diet, passing flatus, and urinating well. Patient is discharged home in stable condition on 02/12/21.  Newborn Data: Birth date:02/12/2021  Birth time:1:40 PM  Gender:Female  Living status:Fetal Demise  Apgars:0 ,0  Weight:   Magnesium Sulfate received: No BMZ received: No Rhophylac:N/A MMR:No T-DaP: no Flu: N/A Transfusion:No  Physical exam  Vitals:   02/12/21 1430 02/12/21 1445 02/12/21 1600 02/12/21 1802  BP: 118/65 118/64 122/68 118/62  Pulse:      Resp: $Remo'18 17 17 17  'rTUck$ Temp: 98.8 F (37.1 C) 98.7 F (37.1 C) 98.7 F (37.1 C) 98.7 F (37.1 C)  TempSrc: Oral Oral Oral Oral  SpO2: 100% 99% 100% 99%  Weight:      Height:       General: alert, cooperative, and no distress Lochia: appropriate Uterine Fundus: firm Incision: N/A DVT Evaluation: No evidence of DVT seen on  physical exam. Labs: Lab Results  Component Value Date   WBC 13.4 (H) 02/12/2021   HGB 13.2 02/12/2021   HCT 35.8 (L) 02/12/2021   MCV 84.0 02/12/2021   PLT 212 02/12/2021   CMP Latest Ref Rng & Units 12/30/2019  Glucose 70 - 99 mg/dL 77  BUN 6 - 20 mg/dL 11  Creatinine 0.44 - 1.00 mg/dL 0.78  Sodium 135 - 145 mmol/L 141  Potassium 3.5 - 5.1 mmol/L 3.3(L)  Chloride 98 - 111 mmol/L 105  CO2 22 - 32 mmol/L 27  Calcium 8.9 - 10.3 mg/dL 9.0  Total Protein 6.5 - 8.1 g/dL 7.5  Total Bilirubin 0.3 - 1.2 mg/dL 0.5  Alkaline Phos 38 - 126 U/L 59  AST 15 - 41 U/L 16  ALT 0 - 44 U/L 10   Edinburgh Score: No flowsheet data found.   After visit meds:  Allergies as of 02/12/2021       Reactions   Corn-containing Products    Dog Epithelium    Mold Extract [trichophyton]    Peanut-containing Drug Products    Sesame Seed (diagnostic)         Medication List     STOP taking these medications    Blood Pressure Kit Devi       TAKE these medications    albuterol 108 (90 Base) MCG/ACT inhaler  Commonly known as: VENTOLIN HFA Inhale 1-2 puffs into the lungs every 4 (four) hours as needed for wheezing or shortness of breath.   benzoyl peroxide 5 % external liquid Generic drug: benzoyl peroxide Apply topically 2 (two) times daily.   cetirizine 10 MG tablet Commonly known as: ZYRTEC Take 10 mg by mouth daily.   Clindamycin-Benzoyl Per (Refr) gel APPLY 1 EACH TOPICALLY 2 (TWO) TIMES DAILY.   Flovent HFA 44 MCG/ACT inhaler Generic drug: fluticasone Inhale 2 puffs into the lungs 2 (two) times daily.   Fluticasone-Salmeterol 100-50 MCG/DOSE Aepb Commonly known as: Advair Diskus Inhale 1 puff into the lungs daily.   ibuprofen 600 MG tablet Commonly known as: ADVIL Take 1 tablet (600 mg total) by mouth every 6 (six) hours as needed.   M-Natal Plus 27-1 MG Tabs TAKE 1 TABLET BY MOUTH EVERY DAY         Discharge home in stable condition Infant Feeding:   n/a Infant Disposition:morgue Discharge instruction: per After Visit Summary and Postpartum booklet. Activity: Advance as tolerated. Pelvic rest for 6 weeks.  Diet: routine diet Future Appointments: Future Appointments  Date Time Provider [place]  02/14/2021  2:15 PM [facility] NURSE [facility]  02/14/2021  2:30 PM [facility] US3 [facility]  03/14/2021  1:15 PM Chancy Milroy, MD [facility]   Follow up Visit:  [place] for [facility]. Schedule an appointment as soon as possible for a visit in 2 week(s).   Specialty: Obstetrics and Gynecology Contact information: [place] [address] [phone]                 Please schedule this patient for a In person or virtual postpartum visit in  2 weeks  with the following provider: Any provider. Additional Postpartum F/U: n/a   High risk pregnancy complicated by:  PPROM  @ [redacted]w[redacted]d Delivery mode:  Vaginal, Breech  Anticipated Birth Control:  Condoms   02/12/2021 Annalee Genta, DO

## 2021-02-12 NOTE — Progress Notes (Signed)
Chaplain responded to spiritual consult for support of this mother whose baby was born prematurely.  Mother and father in the room.  Father had just brought a sack lunch.  Father holding baby while mother ate.  Chaplain expressed sympathy, admired tiny baby.  Judging this was an intimate family time, this was a brief visit.  Chaplain available for continued support.  Vernell Morgans Chaplain

## 2021-02-12 NOTE — MAU Provider Note (Signed)
History     CSN: [id number]  Arrival date and time: 02/12/21 0741   Event Date/Time   First Provider Initiated Contact with Patient 02/12/21 [id number]      Chief Complaint  Patient presents with   Rupture of Membranes   HPI Amanda Vincent is a 20 y.o. G2P0010 at 33w5dwho presents stating she thinks her water may have broken. She reports she woke up at 0730 and felt like her legs were wet. She reports yesterday she had an increase in creamy white discharge but this morning it was more clear and like water. She reports feeling intermittent pressure in her abdomen but no pain. She denies bleeding.   She has a history of a 18 week PPROM and delivery in 2021.   OB History     Gravida  2   Para  0   Term  0   Preterm  0   AB  1   Living  0      SAB  1   IAB  0   Ectopic  0   Multiple  0   Live Births  0           Past Medical History:  Diagnosis Date   Acne    Anxiety    Asthma     Past Surgical History:  Procedure Laterality Date   NO PAST SURGERIES      Family History  Problem Relation Age of Onset   Healthy Mother    Healthy Father     Social History   Tobacco Use   Smoking status: Former    Pack years: 0.00    Types: Cigars   Smokeless tobacco: Never   Tobacco comments:    Cigars for marijuana  Vaping Use   Vaping Use: Never used  Substance Use Topics   Alcohol use: Not Currently   Drug use: Not Currently    Types: Marijuana    Allergies:  Allergies  Allergen Reactions   Corn-Containing Products    Dog Epithelium    Mold Extract [Trichophyton]    Peanut-Containing Drug Products    Sesame Seed (Diagnostic)     Medications Prior to Admission  Medication Sig Dispense Refill Last Dose   albuterol (VENTOLIN HFA) 108 (90 Base) MCG/ACT inhaler Inhale 1-2 puffs into the lungs every 4 (four) hours as needed for wheezing or shortness of breath. 1 each 2    benzoyl peroxide (BENZOYL PEROXIDE) 5 % external liquid Apply topically 2  (two) times daily. 142 g 3    Blood Pressure Monitoring (BLOOD PRESSURE KIT) DEVI 1 each by Does not apply route once a week. 1 each 0    cetirizine (ZYRTEC) 10 MG tablet Take 10 mg by mouth daily.      Clindamycin-Benzoyl Per, Refr, gel APPLY 1 EACH TOPICALLY 2 (TWO) TIMES DAILY. 45 g 1    FLOVENT HFA 44 MCG/ACT inhaler Inhale 2 puffs into the lungs 2 (two) times daily.      Fluticasone-Salmeterol (ADVAIR DISKUS) 100-50 MCG/DOSE AEPB Inhale 1 puff into the lungs daily. 60 each 1    Prenatal Vit-Fe Fumarate-FA (M-NATAL PLUS) 27-1 MG TABS TAKE 1 TABLET BY MOUTH EVERY DAY 30 tablet 12     Review of Systems  Constitutional: Negative.  Negative for fatigue and fever.  HENT: Negative.    Respiratory: Negative.  Negative for shortness of breath.   Cardiovascular: Negative.  Negative for chest pain.  Gastrointestinal: Negative.  Negative for abdominal pain, constipation, diarrhea,  nausea and vomiting.  Genitourinary:  Positive for vaginal discharge. Negative for dysuria and vaginal bleeding.  Neurological: Negative.  Negative for dizziness and headaches.  Physical Exam   Blood pressure 127/75, pulse 74, temperature 99.4 F (37.4 C), temperature source Oral, resp. rate 17, last menstrual period 10/04/2020, SpO2 97 %.  Physical Exam Vitals and nursing note reviewed.  Constitutional:      General: She is not in acute distress.    Appearance: She is well-developed.  HENT:     Head: Normocephalic.  Eyes:     Pupils: Pupils are equal, round, and reactive to light.  Cardiovascular:     Rate and Rhythm: Normal rate and regular rhythm.     Heart sounds: Normal heart sounds.  Pulmonary:     Effort: Pulmonary effort is normal. No respiratory distress.     Breath sounds: Normal breath sounds.  Abdominal:     General: Bowel sounds are normal. There is no distension.     Palpations: Abdomen is soft.     Tenderness: There is no abdominal tenderness.  Genitourinary:    Vagina: Vaginal discharge  present.     Comments: SSE: pooling clear fluid, BBOW through cervical os Skin:    General: Skin is warm and dry.  Neurological:     Mental Status: She is alert and oriented to person, place, and time.  Psychiatric:        Mood and Affect: Mood normal.        Behavior: Behavior normal.        Thought Content: Thought content normal.        Judgment: Judgment normal.   FHT: 140 bpm  MAU Course  Procedures  MDM Clear fluid trickling from introitus before speculum exam Sterile speculum exam showed pooling of clear fluid in vault, bulging membranes coming through cervical os Fern- positive  Dr. Nelda Marseille notified of patient arrival and likely inevitable loss. Will come see patient in MAU.   Patient desires to go home and pick up support person before admission. Risks of leaving with BBOW reviewed. Patient verbalized understanding and states she will return as quickly as she can. Dr. Nelda Marseille notified of patient desire and ok with plan. Patient to be direct admission to [facility] upon arrival  Assessment and Plan   1. Preterm premature rupture of membranes (PPROM) with unknown onset of labor   2. [redacted] weeks gestation of pregnancy    -Discharge home -Patient to return as soon as possible for admission to [place] 02/12/2021, 8:04 AM

## 2021-02-14 ENCOUNTER — Telehealth: Payer: Self-pay | Admitting: *Deleted

## 2021-02-14 ENCOUNTER — Ambulatory Visit: Payer: Medicaid Other

## 2021-02-14 ENCOUNTER — Ambulatory Visit: Payer: Medicaid Other | Attending: Obstetrics

## 2021-02-14 ENCOUNTER — Other Ambulatory Visit: Payer: Self-pay

## 2021-02-14 NOTE — Telephone Encounter (Signed)
Transition Care Management Follow-up Telephone Call Date of discharge and from where: 02/12/2021 - [facility] How have you been since you were released from the hospital? "A little tired but I am okay" Any questions or concerns? No  Items Reviewed: Did the pt receive and understand the discharge instructions provided? Yes  Medications obtained and verified? Yes  Other? No  Any new allergies since your discharge? No  Dietary orders reviewed? No Do you have support at home? Yes    Functional Questionnaire: (I = Independent and D = Dependent) ADLs: I  Bathing/Dressing- I  Meal Prep- I  Eating- I  Maintaining continence- I  Transferring/Ambulation- I  Managing Meds- I  Follow up appointments reviewed:  PCP Hospital f/u appt confirmed? No   Specialist Hospital f/u appt confirmed? Yes  Scheduled to see OBGYN on 02/14/2021 @ 1415 and 03/14/2021 @ 1315. Are transportation arrangements needed? No  If their condition worsens, is the pt aware to call PCP or go to the Emergency Dept.? Yes Was the patient provided with contact information for the PCP's office or ED? Yes Was to pt encouraged to call back with questions or concerns? Yes

## 2021-02-15 LAB — SURGICAL PATHOLOGY

## 2021-02-23 ENCOUNTER — Telehealth (HOSPITAL_COMMUNITY): Payer: Self-pay | Admitting: *Deleted

## 2021-02-23 NOTE — Telephone Encounter (Signed)
Phone call to phone number listed as patient's mobile number - RN left message requesting patient return RN call. Phone call placed to home number - woman answered stating, "I am Amanda Vincent's mom." Patient's mom verified client's mobile phone number. Stated patient is "at work." Deforest Hoyles, RN, 02/23/21, [phone].

## 2021-03-14 ENCOUNTER — Encounter: Payer: Medicaid Other | Admitting: Obstetrics and Gynecology

## 2021-03-30 ENCOUNTER — Other Ambulatory Visit: Payer: Self-pay | Admitting: Student in an Organized Health Care Education/Training Program

## 2021-03-30 ENCOUNTER — Encounter (HOSPITAL_COMMUNITY): Payer: Self-pay

## 2021-03-30 ENCOUNTER — Other Ambulatory Visit: Payer: Self-pay

## 2021-03-30 ENCOUNTER — Ambulatory Visit (HOSPITAL_COMMUNITY)
Admission: EM | Admit: 2021-03-30 | Discharge: 2021-03-30 | Disposition: A | Discharge status: Home / Self Care | Payer: Medicaid Other | Attending: Medical Oncology | Admitting: Medical Oncology

## 2021-03-30 DIAGNOSIS — N3001 Acute cystitis with hematuria: Secondary | ICD-10-CM | POA: Insufficient documentation

## 2021-03-30 LAB — POCT URINALYSIS DIPSTICK, ED / UC
Bilirubin Urine: NEGATIVE
Glucose, UA: NEGATIVE mg/dL
Nitrite: NEGATIVE
Protein, ur: 30 mg/dL — AB
Specific Gravity, Urine: >=1.03 (ref 1.005–1.030)
Urobilinogen, UA: 1 mg/dL (ref 0.0–1.0)
pH: 6 (ref 5.0–8.0)

## 2021-03-30 LAB — POC URINE PREG, ED: Preg Test, Ur: NEGATIVE

## 2021-03-30 MED ORDER — KETOROLAC TROMETHAMINE 30 MG/ML IJ SOLN
INTRAMUSCULAR | Status: AC
  Filled 2021-03-30: qty 1

## 2021-03-30 MED ORDER — SULFAMETHOXAZOLE-TRIMETHOPRIM 800-160 MG PO TABS: 1.0000 | ORAL_TABLET | Freq: Two times a day (BID) | ORAL | 0 refills | Status: AC

## 2021-03-30 NOTE — ED Provider Notes (Signed)
[facility]    CSN: [id number] Arrival date & time: 03/30/21  9892      History   Chief Complaint Chief Complaint  Patient presents with   Dysuria    HPI Amanda Vincent is a 20 y.o. female.   HPI  Dysuria: Pt reports that she has had dysuria for the past 4 days. She has tried AZO without much relief. No fever, pelvic pain, vomiting. No recent surgeries or procedures.   Past Medical History:  Diagnosis Date   Acne    Anxiety    Asthma     Patient Active Problem List   Diagnosis Date Noted   Preterm premature rupture of membranes (PPROM) with unknown onset of labor 02/12/2021   Supervision of high risk pregnancy, antepartum 01/11/2021   Asthma    Contraceptive management 08/05/2018   Acne vulgaris 04/29/2017    Past Surgical History:  Procedure Laterality Date   NO PAST SURGERIES      OB History     Gravida  2   Para  0   Term  0   Preterm  0   AB  1   Living  0      SAB  1   IAB  0   Ectopic  0   Multiple  0   Live Births  0            Home Medications    Prior to Admission medications   Medication Sig Start Date End Date Taking? Authorizing Provider  sulfamethoxazole-trimethoprim (BACTRIM DS) 800-160 MG tablet Take 1 tablet by mouth 2 (two) times daily for 3 days. 03/30/21 04/02/21 Yes Saman Umstead, Brand Males, PA-C  albuterol (VENTOLIN HFA) 108 (90 Base) MCG/ACT inhaler Inhale 1-2 puffs into the lungs every 4 (four) hours as needed for wheezing or shortness of breath. 10/27/20   Anderson, Chelsey L, DO  benzoyl peroxide (BENZOYL PEROXIDE) 5 % external liquid Apply topically 2 (two) times daily. 10/27/20   Anderson, Chelsey L, DO  cetirizine (ZYRTEC) 10 MG tablet Take 10 mg by mouth daily.    [provider]  Clindamycin-Benzoyl Per, Refr, gel APPLY 1 EACH TOPICALLY 2 (TWO) TIMES DAILY. 12/20/20   Anderson, Chelsey L, DO  FLOVENT HFA 44 MCG/ACT inhaler Inhale 2 puffs into the lungs 2 (two) times daily. 12/23/20   [provider]  Fluticasone-Salmeterol (ADVAIR DISKUS) 100-50 MCG/DOSE AEPB Inhale 1 puff into the lungs daily. 10/29/20   Anderson, Chelsey L, DO  ibuprofen (ADVIL) 600 MG tablet Take 1 tablet (600 mg total) by mouth every 6 (six) hours as needed. 02/12/21   Myna Hidalgo, DO  Prenatal Vit-Fe Fumarate-FA (M-NATAL PLUS) 27-1 MG TABS TAKE 1 TABLET BY MOUTH EVERY DAY 06/15/20   Anyanwu, Jethro Bastos, MD    Family History Family History  Problem Relation Age of Onset   Healthy Mother    Healthy Father     Social History Social History   Tobacco Use   Smoking status: Former    Types: Cigars   Smokeless tobacco: Never   Tobacco comments:    Cigars for marijuana  Vaping Use   Vaping Use: Never used  Substance Use Topics   Alcohol use: Not Currently   Drug use: Not Currently    Types: Marijuana     Allergies   Corn-containing products, Dog epithelium, Mold extract [trichophyton], Peanut-containing drug products, and Sesame seed (diagnostic)   Review of Systems Review of Systems  As stated above in HPI Physical  Exam Triage Vital Signs ED Triage Vitals  Enc Vitals Group     BP 03/30/21 0935 117/74     Pulse Rate 03/30/21 0935 92     Resp 03/30/21 0935 19     Temp 03/30/21 0935 98.6 F (37 C)     Temp Source 03/30/21 0935 Oral     SpO2 03/30/21 0935 99 %     Weight --      Height --      Head Circumference --      Peak Flow --      Pain Score 03/30/21 0932 4     Pain Loc --      Pain Edu? --      Excl. in GC? --    No data found.  Updated Vital Signs BP 117/74 (BP Location: Right Arm)   Pulse 92   Temp 98.6 F (37 C) (Oral)   Resp 19   LMP 10/04/2020   SpO2 99%   Breastfeeding Unknown Comment: Recent  Physical Exam Vitals and nursing note reviewed.  Constitutional:      General: She is not in acute distress.    Appearance: Normal appearance. She is not ill-appearing, toxic-appearing or diaphoretic.  Cardiovascular:     Rate and Rhythm: Normal rate and  regular rhythm.     Heart sounds: Normal heart sounds.  Pulmonary:     Effort: Pulmonary effort is normal.     Breath sounds: Normal breath sounds.  Abdominal:     General: Bowel sounds are normal. There is no distension.     Palpations: Abdomen is soft. There is no mass.     Tenderness: There is no abdominal tenderness. There is no right CVA tenderness, left CVA tenderness, guarding or rebound.     Hernia: No hernia is present.  Skin:    General: Skin is warm.  Neurological:     Mental Status: She is alert and oriented to person, place, and time.     UC Treatments / Results  Labs (all labs ordered are listed, but only abnormal results are displayed) Labs Reviewed  POCT URINALYSIS DIPSTICK, ED / UC - Abnormal; Notable for the following components:      Result Value   Ketones, ur TRACE (*)    Hgb urine dipstick TRACE (*)    Protein, ur 30 (*)    Leukocytes,Ua TRACE (*)    All other components within normal limits  URINE CULTURE  HSV CULTURE AND TYPING  POC URINE PREG, ED  CERVICOVAGINAL ANCILLARY ONLY    EKG   Radiology No results found.  Procedures Procedures (including critical care time)  Medications Ordered in UC Medications - No data to display  Initial Impression / Assessment and Plan / UC Course  I have reviewed the triage vital signs and the nursing notes.  Pertinent labs & imaging results that were available during my care of the patient were reviewed by me and considered in my medical decision making (see chart for details).     New. Treating with Bactrim. Discussed how to use along with common potential side effects and precautions. Discussed red flag signs and symptoms. Hydration with water encouraged.  Final Clinical Impressions(s) / UC Diagnoses   Final diagnoses:  Acute cystitis with hematuria   Discharge Instructions   None    ED Prescriptions     Medication Sig Dispense Auth. Provider   sulfamethoxazole-trimethoprim (BACTRIM DS)  800-160 MG tablet Take 1 tablet by mouth 2 (two) times daily  for 3 days. 6 tablet Rushie Chestnut, [place]      PDMP not reviewed this encounter.   Rushie Chestnut, Cordelia Poche 03/30/21 2010

## 2021-03-30 NOTE — ED Triage Notes (Signed)
Pt c/o dysuria x 4 days.  States she took AZO for two days and states yesterday she noticed bumps on her vagina.

## 2021-03-31 LAB — CERVICOVAGINAL ANCILLARY ONLY
Bacterial Vaginitis (gardnerella): NEGATIVE
Candida Glabrata: NEGATIVE
Candida Vaginitis: NEGATIVE
Chlamydia: NEGATIVE
Comment: NEGATIVE
Comment: NEGATIVE
Comment: NEGATIVE
Comment: NEGATIVE
Comment: NEGATIVE
Comment: NORMAL
Neisseria Gonorrhea: NEGATIVE
Trichomonas: NEGATIVE

## 2021-03-31 LAB — URINE CULTURE: Culture: 1000 — AB

## 2021-04-01 LAB — HSV CULTURE AND TYPING

## 2021-06-01 ENCOUNTER — Other Ambulatory Visit: Payer: Self-pay

## 2021-06-08 ENCOUNTER — Other Ambulatory Visit: Payer: Self-pay | Admitting: Family Medicine

## 2021-12-28 ENCOUNTER — Other Ambulatory Visit: Payer: Self-pay | Admitting: Family Medicine

## 2022-01-24 ENCOUNTER — Encounter: Payer: Self-pay | Admitting: *Deleted

## 2022-02-12 ENCOUNTER — Inpatient Hospital Stay (HOSPITAL_COMMUNITY): Payer: Medicaid Other

## 2022-02-12 ENCOUNTER — Encounter (HOSPITAL_COMMUNITY): Payer: Self-pay | Admitting: Obstetrics & Gynecology

## 2022-02-12 ENCOUNTER — Inpatient Hospital Stay (HOSPITAL_COMMUNITY)
Admission: AD | Admit: 2022-02-12 | Discharge: 2022-02-12 | Disposition: A | Discharge status: Home / Self Care | Payer: Medicaid Other | Attending: Obstetrics & Gynecology | Admitting: Obstetrics & Gynecology

## 2022-02-12 DIAGNOSIS — O039 Complete or unspecified spontaneous abortion without complication: Secondary | ICD-10-CM | POA: Insufficient documentation

## 2022-02-12 DIAGNOSIS — O209 Hemorrhage in early pregnancy, unspecified: Secondary | ICD-10-CM | POA: Diagnosis not present

## 2022-02-12 DIAGNOSIS — R58 Hemorrhage, not elsewhere classified: Secondary | ICD-10-CM | POA: Diagnosis not present

## 2022-02-12 DIAGNOSIS — R52 Pain, unspecified: Secondary | ICD-10-CM | POA: Diagnosis not present

## 2022-02-12 DIAGNOSIS — Z3A12 12 weeks gestation of pregnancy: Secondary | ICD-10-CM | POA: Insufficient documentation

## 2022-02-12 DIAGNOSIS — D62 Acute posthemorrhagic anemia: Secondary | ICD-10-CM | POA: Insufficient documentation

## 2022-02-12 DIAGNOSIS — O200 Threatened abortion: Secondary | ICD-10-CM | POA: Diagnosis not present

## 2022-02-12 DIAGNOSIS — R1084 Generalized abdominal pain: Secondary | ICD-10-CM | POA: Diagnosis not present

## 2022-02-12 DIAGNOSIS — O99511 Diseases of the respiratory system complicating pregnancy, first trimester: Secondary | ICD-10-CM | POA: Insufficient documentation

## 2022-02-12 LAB — URINALYSIS, ROUTINE W REFLEX MICROSCOPIC
Bilirubin Urine: NEGATIVE
Glucose, UA: NEGATIVE mg/dL
Ketones, ur: NEGATIVE mg/dL
Leukocytes,Ua: NEGATIVE
Nitrite: NEGATIVE
Protein, ur: 100 mg/dL — AB
RBC / HPF: >50 RBC/hpf — ABNORMAL HIGH (ref 0–5)
Specific Gravity, Urine: 1.029 (ref 1.005–1.030)
pH: 5 (ref 5.0–8.0)

## 2022-02-12 LAB — CBC
HCT: 27.8 % — ABNORMAL LOW (ref 36.0–46.0)
Hemoglobin: 9.8 g/dL — ABNORMAL LOW (ref 12.0–15.0)
MCH: 29 pg (ref 26.0–34.0)
MCHC: 35.3 g/dL (ref 30.0–36.0)
MCV: 82.2 fL (ref 80.0–100.0)
Platelets: 234 10*3/uL (ref 150–400)
RBC: 3.38 MIL/uL — ABNORMAL LOW (ref 3.87–5.11)
RDW: 12.9 % (ref 11.5–15.5)
WBC: 11.9 10*3/uL — ABNORMAL HIGH (ref 4.0–10.5)
nRBC: 0 % (ref 0.0–0.2)

## 2022-02-12 LAB — TYPE AND SCREEN
ABO/RH(D): O POS
Antibody Screen: NEGATIVE

## 2022-02-12 LAB — POCT PREGNANCY, URINE: Preg Test, Ur: POSITIVE — AB

## 2022-02-12 LAB — WET PREP, GENITAL
Clue Cells Wet Prep HPF POC: NONE SEEN
Sperm: NONE SEEN
Trich, Wet Prep: NONE SEEN
WBC, Wet Prep HPF POC: >=10 — AB (ref <10)
Yeast Wet Prep HPF POC: NONE SEEN

## 2022-02-12 LAB — HCG, QUANTITATIVE, PREGNANCY: hCG, Beta Chain, Quant, S: 11178 m[IU]/mL — ABNORMAL HIGH (ref <5)

## 2022-02-12 MED ORDER — LACTATED RINGERS IV BOLUS
1000.0000 mL | Freq: Once | INTRAVENOUS | Status: AC
  Administered 2022-02-12: 1000 mL via INTRAVENOUS

## 2022-02-12 MED ORDER — FERROUS SULFATE 325 (65 FE) MG PO TABS
325.0000 mg | ORAL_TABLET | ORAL | 1 refills | Status: DC
Start: 2022-02-12 — End: 2023-12-26

## 2022-02-12 NOTE — MAU Note (Addendum)
Pt arrived via EMS c/o heavy vaginal bleeding that started at 8pm last night. She stated that she has soaked through multiple pads and became lightheaded and hot. Patient stated that this is her third pregnancy. She stated that she had two 18 wk miscarriages.   Patient has #20ga to the (L) ac inseted by ems. She received fentanyl by ems and 500 ml of fluid.

## 2022-02-12 NOTE — MAU Provider Note (Signed)
History     [id number]  Arrival date and time: 02/12/22 1053    Chief Complaint  Patient presents with   Vaginal Bleeding     HPI Amanda Vincent is a 21 y.o. at [redacted]w[redacted]d who presents via EMS for vaginal bleeding. [facility]. Should've been ~10 weeks at the time based on a sure LMP. Had an ultrasound that showed an early pregnancy but no heartbeat. Reports bleeding & pain started yesterday evening. States she passed severe large clots & was bleeding so much this morning she felt like she was going to pass out. Also had abdominal pain which has resolved since receiving fentanyl on the ambulance.    OB History     Gravida  3   Para  0   Term  0   Preterm  0   AB  2   Living  0      SAB  2   IAB  0   Ectopic  0   Multiple  0   Live Births  0           Past Medical History:  Diagnosis Date   Acne    Anxiety    Asthma     Past Surgical History:  Procedure Laterality Date   NO PAST SURGERIES      Family History  Problem Relation Age of Onset   Healthy Mother    Healthy Father     Allergies  Allergen Reactions   Corn-Containing Products    Dog Epithelium    Mold Extract [Trichophyton]    Peanut-Containing Drug Products    Sesame Seed (Diagnostic)     No current facility-administered medications on file prior to encounter.   Current Outpatient Medications on File Prior to Encounter  Medication Sig Dispense Refill   Clindamycin-Benzoyl Per, Refr, gel APPLY 1 EACH TOPICALLY 2 (TWO) TIMES DAILY. 45 g 1   albuterol (VENTOLIN HFA) 108 (90 Base) MCG/ACT inhaler Inhale 1-2 puffs into the lungs every 4 (four) hours as needed for wheezing or shortness of breath. 1 each 2   benzoyl peroxide (BENZOYL PEROXIDE) 5 % external liquid Apply topically 2 (two) times daily. 142 g 3   cetirizine (ZYRTEC) 10 MG tablet Take 10 mg by mouth daily.     FLOVENT HFA 44 MCG/ACT inhaler Inhale 2 puffs into the lungs  2 (two) times daily.     Fluticasone-Salmeterol (ADVAIR DISKUS) 100-50 MCG/DOSE AEPB Inhale 1 puff into the lungs daily. 60 each 1   ibuprofen (ADVIL) 600 MG tablet Take 1 tablet (600 mg total) by mouth every 6 (six) hours as needed. 30 tablet 0   Prenatal Vit-Fe Fumarate-FA (M-NATAL PLUS) 27-1 MG TABS TAKE 1 TABLET BY MOUTH EVERY DAY 30 tablet 12     ROS Pertinent positives and negative per HPI, all others reviewed and negative  Physical Exam   BP 107/64 (BP Location: Right Arm)   Pulse 94   Temp 98.8 F (37.1 C) (Oral)   Resp 16   LMP 11/20/2021   SpO2 100%   Patient Vitals for the past 24 hrs:  BP Temp Temp src Pulse Resp SpO2  02/12/22 1453 107/64 -- -- 94 16 100 %  02/12/22 1348 122/74 -- -- (!) 119 -- --  02/12/22 1345 113/63 -- -- 98 -- 100 %  02/12/22 1344 (!) 105/53 -- -- 99 -- --  02/12/22 1343 (!) 116/57 -- -- 67 -- --  02/12/22 1200  102/62 -- -- (!) 118 -- 100 %  02/12/22 1157 101/71 -- -- (!) 142 -- --  02/12/22 1156 110/68 -- -- 71 -- --  02/12/22 1155 111/60 -- -- 67 -- 100 %  02/12/22 1145 -- -- -- -- -- 100 %  02/12/22 1140 -- -- -- -- -- 98 %  02/12/22 1135 -- -- -- -- -- 99 %  02/12/22 1130 -- -- -- -- -- 99 %  02/12/22 1125 -- -- -- -- -- 98 %  02/12/22 1120 -- -- -- -- -- 97 %  02/12/22 1115 -- -- -- -- -- 96 %  02/12/22 1110 -- -- -- -- -- 96 %  02/12/22 1105 -- -- -- -- -- 96 %  02/12/22 1104 117/73 98.8 F (37.1 C) Oral 91 18 --    Physical Exam Vitals and nursing note reviewed. Exam conducted with a chaperone present.  Constitutional:      General: She is not in acute distress.    Appearance: Normal appearance. She is not diaphoretic.  HENT:     Head: Normocephalic and atraumatic.  Eyes:     General: No scleral icterus.    Conjunctiva/sclera: Conjunctivae normal.  Pulmonary:     Effort: Pulmonary effort is normal. No respiratory distress.  Abdominal:     Palpations: Abdomen is soft.     Tenderness: There is no abdominal tenderness.  There is no guarding or rebound.  Genitourinary:    Comments: Pelvic: NEFG. Small amount of dark red blood cleared out with 2 fox swabs. No active bleeding. Cervix closed. Cervix pink/smooth/not friable.  Skin:    General: Skin is warm and dry.  Neurological:     Mental Status: She is alert.  Psychiatric:        Mood and Affect: Mood normal.        Behavior: Behavior normal.        Labs Results for orders placed or performed during the hospital encounter of 02/12/22 (from the past 24 hour(s))  Urinalysis, Routine w reflex microscopic Urine, Clean Catch     Status: Abnormal   Collection Time: 02/12/22 11:00 AM  Result Value Ref Range   Color, Urine AMBER (A) YELLOW   APPearance CLOUDY (A) CLEAR   Specific Gravity, Urine 1.029 1.005 - 1.030   pH 5.0 5.0 - 8.0   Glucose, UA NEGATIVE NEGATIVE mg/dL   Hgb urine dipstick LARGE (A) NEGATIVE   Bilirubin Urine NEGATIVE NEGATIVE   Ketones, ur NEGATIVE NEGATIVE mg/dL   Protein, ur 086 (A) NEGATIVE mg/dL   Nitrite NEGATIVE NEGATIVE   Leukocytes,Ua NEGATIVE NEGATIVE   RBC / HPF >50 (H) 0 - 5 RBC/hpf   WBC, UA 0-5 0 - 5 WBC/hpf   Bacteria, UA FEW (A) NONE SEEN   Squamous Epithelial / LPF 0-5 0 - 5   Mucus PRESENT   Pregnancy, urine POC     Status: Abnormal   Collection Time: 02/12/22 11:03 AM  Result Value Ref Range   Preg Test, Ur POSITIVE (A) NEGATIVE  CBC     Status: Abnormal   Collection Time: 02/12/22 11:35 AM  Result Value Ref Range   WBC 11.9 (H) 4.0 - 10.5 K/uL   RBC 3.38 (L) 3.87 - 5.11 MIL/uL   Hemoglobin 9.8 (L) 12.0 - 15.0 g/dL   HCT 57.8 (L) 46.9 - 62.9 %   MCV 82.2 80.0 - 100.0 fL   MCH 29.0 26.0 - 34.0 pg   MCHC 35.3 30.0 - 36.0 g/dL  RDW 12.9 11.5 - 15.5 %   Platelets 234 150 - 400 K/uL   nRBC 0.0 0.0 - 0.2 %  Type and screen     Status: None   Collection Time: 02/12/22 11:35 AM  Result Value Ref Range   ABO/RH(D) O POS    Antibody Screen NEG    Sample Expiration      02/15/2022,2359 Performed at [facility], [address], [place], [place] [address]   hCG, quantitative, pregnancy     Status: Abnormal   Collection Time: 02/12/22 11:35 AM  Result Value Ref Range   hCG, Beta Chain, Quant, S 11,178 (H) <5 mIU/mL  Wet prep, genital     Status: Abnormal   Collection Time: 02/12/22 11:55 AM  Result Value Ref Range   Yeast Wet Prep HPF POC NONE SEEN NONE SEEN   Trich, Wet Prep NONE SEEN NONE SEEN   Clue Cells Wet Prep HPF POC NONE SEEN NONE SEEN   WBC, Wet Prep HPF POC >=10 (A) <10   Sperm NONE SEEN     Imaging US OB LESS THAN 14 WEEKS WITH OB TRANSVAGINAL  Result Date: 02/12/2022 CLINICAL DATA:  Heavy vaginal bleeding for 1 day. Positive pregnancy test. EXAM: OBSTETRIC <14 WK [place] AND TRANSVAGINAL OB US TECHNIQUE: Both transabdominal and transvaginal ultrasound examinations were performed for complete evaluation of the gestation as well as the maternal uterus, adnexal regions, and pelvic cul-de-sac. Transvaginal technique was performed to assess early pregnancy. COMPARISON:  None Available. FINDINGS: Intrauterine gestational sac: None. Yolk sac:  Not Visualized. Embryo:  Not Visualized. Cardiac Activity: Not Visualized. Heart Rate: N/A  bpm MSD:   mm    w     d CRL:    mm    w    d                  [place] EDC: Subchorionic hemorrhage:  None visualized. Maternal uterus/adnexae: Neither maternal ovary could be visualized on transabdominal or endovaginal scanning. Endometrium measures up to 10 mm maximum thickness in the lower uterine segment. No discrete mass or hypervascularity on color Doppler imaging. IMPRESSION: 1. No evidence for intrauterine gestational sac. No adnexal mass visualized although neither ovary could be discerned on transabdominal or endovaginal scanning. 2.  No intraperitoneal free fluid. 3. In the setting of recent known pregnancy, findings are nonspecific and could indicate endometrial blood products although hypovascular retained products of conception cannot be excluded.  Consider short term follow-up pelvic ultrasound or further evaluation with pelvic MRI with and without IV contrast as clinically warranted. Electronically Signed   By: Kennith Center M.D.   On: 02/12/2022 12:47    MAU Course  Procedures Lab Orders         Wet prep, genital         Urinalysis, Routine w reflex microscopic Urine, Clean Catch         CBC         hCG, quantitative, pregnancy         Pregnancy, urine POC     Meds ordered this encounter  Medications   lactated ringers bolus 1,000 mL   lactated ringers bolus 1,000 mL   ferrous sulfate (FERROUSUL) 325 (65 FE) MG tablet    Sig: Take 1 tablet (325 mg total) by mouth every other day.    Dispense:  15 tablet    Refill:  1    Order Specific Question:   Supervising Provider  Answer:   Jaynie Collins A [[id number]]   Imaging Orders         US OB LESS THAN 14 WEEKS WITH OB TRANSVAGINAL      MDM RH positive  Patient [facility]. Shows IUP that appears to be about 6 weeks. Was told there wasn't a heart beat & was supposed to f/u with them on 6/29. Today's ultrasound shows no IUP consistent with completed miscarriage.  On exam, bleeding is stable.  Reviewed 2 liters of IV fluids. Vital signs improved & patient states she feels much better.  Assessment and Plan   1. Complete miscarriage   2. Anemia due to acute blood loss   3. [redacted] weeks gestation of pregnancy    -Pelvic rest -Reviewed bleeding/infection precautions & reasons to return to MAU -Rx ferrous sulfate -Message send to office for follow up appointments  Judeth Horn, NP 02/12/22 3:25 PM

## 2022-02-13 LAB — GC/CHLAMYDIA PROBE AMP (~~LOC~~) NOT AT ARMC
Chlamydia: NEGATIVE
Comment: NEGATIVE
Comment: NORMAL
Neisseria Gonorrhea: NEGATIVE

## 2022-02-22 ENCOUNTER — Other Ambulatory Visit: Payer: Medicaid Other

## 2022-02-22 ENCOUNTER — Other Ambulatory Visit: Payer: Self-pay

## 2022-02-22 DIAGNOSIS — O039 Complete or unspecified spontaneous abortion without complication: Secondary | ICD-10-CM

## 2022-02-23 ENCOUNTER — Telehealth: Payer: Self-pay

## 2022-02-23 DIAGNOSIS — O039 Complete or unspecified spontaneous abortion without complication: Secondary | ICD-10-CM

## 2022-02-23 LAB — BETA HCG QUANT (REF LAB): hCG Quant: 117 m[IU]/mL

## 2022-02-23 NOTE — Telephone Encounter (Addendum)
-----   Message from Nora Bing, MD sent at 02/23/2022  1:27 PM EDT ----- Needs a non stat beta in 7-14d due to the large drop showing presumed miscarriage. Thanks  Notified pt of results and the need for f/u non stat beta.  Pt stated that she will be able to come on 03/06/22 @ 1030.  Pt scheduled.    Leonette Nutting  02/23/22

## 2022-02-23 NOTE — Progress Notes (Unsigned)
    SUBJECTIVE:   CHIEF COMPLAINT / HPI: medication management   Acne  Patient reports she has been using clindamycin and benzoyl topical since she was in 10th grade, for about 5 years  She reports that her face cleared up but the areas of hyperpigmentation on her body have not improved  She reports noticing the skin changes on her posterior thighs and upper extremities Areas are reported to be mildly pruritic  Asthma  She reports needing refills She reports symptoms are well controlled during the day time  Her symptoms are occurring 3/7 nights per week and include her waking up feeling the need to cough or take deep breaths  She has to use the albuterol inhaler mostly in the middle of the night, uses 2 puffs and then can go back to sleep  She reports that she goes to the gym and can run without having SOB and does not need inhaler She states that she has a cat,dog and [place] pig that she states she has been tested for and is allergic to   HM  Recommended for Hep c testing and HIV screening    PERTINENT  PMH / PSH:  Asthma  Acne    OBJECTIVE:   BP 102/60   Pulse 88   Ht 5\' 3"  (1.6 m)   Wt 125 lb (56.7 kg)   LMP 11/20/2021   SpO2 98%   BMI 22.14 kg/m   Physical Exam Cardiovascular:     Pulses: Normal pulses.     Heart sounds: Normal heart sounds. No murmur heard.    No friction rub.  Pulmonary:     Effort: Pulmonary effort is normal. No respiratory distress.     Breath sounds: Normal breath sounds. No stridor. No wheezing, rhonchi or rales.  Skin:    Capillary Refill: Capillary refill takes less than 2 seconds.  Neurological:     Mental Status: She is oriented to person, place, and time.      ASSESSMENT/PLAN:   No problem-specific Assessment & Plan notes found for this encounter.     01/20/2022, MD [facility]

## 2022-02-24 ENCOUNTER — Ambulatory Visit (INDEPENDENT_AMBULATORY_CARE_PROVIDER_SITE_OTHER): Payer: Medicaid Other | Admitting: Family Medicine

## 2022-02-24 ENCOUNTER — Encounter: Payer: Self-pay | Admitting: Family Medicine

## 2022-02-24 VITALS — BP 102/60 | HR 88 | Ht 63.0 in | Wt 125.0 lb

## 2022-02-24 DIAGNOSIS — J4540 Moderate persistent asthma, uncomplicated: Secondary | ICD-10-CM | POA: Diagnosis not present

## 2022-02-24 DIAGNOSIS — L700 Acne vulgaris: Secondary | ICD-10-CM | POA: Diagnosis not present

## 2022-02-24 DIAGNOSIS — Z114 Encounter for screening for human immunodeficiency virus [HIV]: Secondary | ICD-10-CM | POA: Diagnosis not present

## 2022-02-24 DIAGNOSIS — Z1159 Encounter for screening for other viral diseases: Secondary | ICD-10-CM

## 2022-02-24 DIAGNOSIS — Z79899 Other long term (current) drug therapy: Secondary | ICD-10-CM

## 2022-02-24 LAB — POCT URINE PREGNANCY: Preg Test, Ur: POSITIVE — AB

## 2022-02-24 MED ORDER — ADAPALENE 0.1 % EX CREA: TOPICAL_CREAM | Freq: Every day | CUTANEOUS | 1 refills | Status: DC

## 2022-02-24 MED ORDER — CLINDAMYCIN PHOS-BENZOYL PEROX 1.2-5 % EX GEL: 1.0000 | Freq: Two times a day (BID) | CUTANEOUS | 1 refills | Status: DC

## 2022-02-24 MED ORDER — FLUTICASONE PROPIONATE HFA 44 MCG/ACT IN AERO
2.0000 | INHALATION_SPRAY | Freq: Two times a day (BID) | RESPIRATORY_TRACT | 12 refills | Status: DC
Start: 2022-02-24 — End: 2023-12-26

## 2022-02-24 NOTE — Patient Instructions (Signed)
I have prescribed a additional medicine for you to apply to your skin at bedtime. Please limit your time in the sun as this medication will make you very sensitive to sunlight and can cause damage to your skin.  Please continue to use the clindamycin benzyl peroxide as you have previously been using.  Today we will check for HIV and hepatitis C screening.  I will notify you of any abnormal results.  I have prescribed Flovent inhaler for you to use on a daily basis.  You can continue to use albuterol on an as-needed basis for any wheezing or shortness of breath.  Please follow-up with Amanda Vincent in 1 month to check on your asthma and skin.

## 2022-02-25 DIAGNOSIS — Z114 Encounter for screening for human immunodeficiency virus [HIV]: Secondary | ICD-10-CM | POA: Insufficient documentation

## 2022-02-25 DIAGNOSIS — Z1159 Encounter for screening for other viral diseases: Secondary | ICD-10-CM | POA: Insufficient documentation

## 2022-02-25 DIAGNOSIS — Z79899 Other long term (current) drug therapy: Secondary | ICD-10-CM | POA: Insufficient documentation

## 2022-02-25 LAB — HEPATITIS C ANTIBODY: Hep C Virus Ab: NONREACTIVE

## 2022-02-25 LAB — HIV ANTIBODY (ROUTINE TESTING W REFLEX): HIV Screen 4th Generation wRfx: NONREACTIVE

## 2022-02-25 NOTE — Assessment & Plan Note (Signed)
Patient with 3/7 nights per week of needing albuterol Hailer in the middle of the night due to cough Patient has not been using maintenance controller therapy Discontinue Advair as patient never picked this prescription up We will refill Flovent and have patient use albuterol as needed Discussed that patient will need to use Flovent on a daily basis Patient to follow-up for asthma as directed on AVS

## 2022-02-25 NOTE — Assessment & Plan Note (Signed)
Urine pregnancy test collected today, positive Noted in chart review the patient had recent miscarriage in later half of June Suspect that beta hCG levels are decreasing, however patient using topical retinoids we will continue prescription for acne vulgaris as listed above

## 2022-02-25 NOTE — Assessment & Plan Note (Signed)
Hepatitis C screening ordered today  

## 2022-02-25 NOTE — Assessment & Plan Note (Addendum)
We will continue the clindamycin benzyl peroxide gel and add Differin cream for patient to apply at bedtime Patient will follow-up as directed on AVS Counseled on avoiding prolonged sun exposure while using retinoid topical therapy

## 2022-02-25 NOTE — Assessment & Plan Note (Signed)
HIV screening ordered today  

## 2022-03-06 ENCOUNTER — Other Ambulatory Visit: Payer: Medicaid Other

## 2022-03-13 ENCOUNTER — Ambulatory Visit (INDEPENDENT_AMBULATORY_CARE_PROVIDER_SITE_OTHER): Payer: Medicaid Other | Admitting: Obstetrics and Gynecology

## 2022-03-13 ENCOUNTER — Encounter: Payer: Self-pay | Admitting: Obstetrics and Gynecology

## 2022-03-13 ENCOUNTER — Other Ambulatory Visit: Payer: Self-pay

## 2022-03-13 DIAGNOSIS — O039 Complete or unspecified spontaneous abortion without complication: Secondary | ICD-10-CM

## 2022-03-13 NOTE — Progress Notes (Signed)
Pt reports no pain just started cycle.

## 2022-03-13 NOTE — Progress Notes (Signed)
  CC: miscarriage follow up Subjective:    Patient ID: Amanda Vincent, female    DOB: [birth year], 21 y.o.   MRN: [id number]  HPI  21 yo [id number] seen for miscarriage follow up.  Review of history showed the patient's other miscarriages were second trimester and secondary to preterm labor or PROM.  Pt declines birth control today.  Review of Systems     Objective:   Physical Exam Vitals:   03/13/22 1621  BP: 97/74  Pulse: 74         Assessment & Plan:   1. Complete miscarriage Last bhxg was 117, will recheck quant bhcg today. 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

## 2022-03-14 LAB — BETA HCG QUANT (REF LAB): hCG Quant: 8 m[IU]/mL

## 2022-03-16 ENCOUNTER — Telehealth: Payer: Self-pay | Admitting: *Deleted

## 2022-03-16 NOTE — Telephone Encounter (Signed)
I called Amanda Vincent and informed her of bhcg results and recommendations per Dr. Donavan Foil. She agreed to appointment on 03/27/22. She voices understanding. [name]

## 2022-03-16 NOTE — Telephone Encounter (Signed)
-----   Message from Warden Fillers, MD sent at 03/15/2022  3:32 PM EDT ----- Bhcg decrease c/w miscarriage, repeat bhcg in 2 weeks

## 2022-03-27 ENCOUNTER — Other Ambulatory Visit: Payer: Self-pay

## 2022-03-27 ENCOUNTER — Other Ambulatory Visit: Payer: Medicaid Other

## 2022-03-27 DIAGNOSIS — O039 Complete or unspecified spontaneous abortion without complication: Secondary | ICD-10-CM

## 2022-06-18 ENCOUNTER — Other Ambulatory Visit: Payer: Self-pay | Admitting: Family Medicine

## 2022-08-04 ENCOUNTER — Other Ambulatory Visit: Payer: Self-pay | Admitting: Family Medicine

## 2022-09-22 ENCOUNTER — Other Ambulatory Visit: Payer: Self-pay | Admitting: Family Medicine

## 2023-01-03 ENCOUNTER — Other Ambulatory Visit: Payer: Self-pay | Admitting: Family Medicine

## 2023-03-02 ENCOUNTER — Ambulatory Visit: Payer: Medicaid Other | Admitting: Family Medicine

## 2023-03-02 ENCOUNTER — Other Ambulatory Visit (HOSPITAL_COMMUNITY)
Admission: RE | Admit: 2023-03-02 | Discharge: 2023-03-02 | Disposition: A | Discharge status: Home / Self Care | Payer: Medicaid Other | Source: Ambulatory Visit | Attending: Family Medicine | Admitting: Family Medicine

## 2023-03-02 ENCOUNTER — Encounter: Payer: Self-pay | Admitting: Family Medicine

## 2023-03-02 VITALS — BP 113/74 | HR 111 | Ht 63.0 in | Wt 127.4 lb

## 2023-03-02 DIAGNOSIS — Z113 Encounter for screening for infections with a predominantly sexual mode of transmission: Secondary | ICD-10-CM | POA: Diagnosis not present

## 2023-03-02 LAB — POCT WET PREP (WET MOUNT)
Clue Cells Wet Prep Whiff POC: POSITIVE
Trichomonas Wet Prep HPF POC: ABSENT

## 2023-03-02 LAB — POCT URINE PREGNANCY: Preg Test, Ur: NEGATIVE

## 2023-03-02 NOTE — Patient Instructions (Addendum)
It was great to see you today! Here's what we talked about:  We tested you for STDs and pregnancy today. I will contact you with results. I recommend using condoms in the future to prevent spread of STDs. Let me know if you would like to discuss birth control further.  Please let me know if you have any other questions.  Dr. Phineas Real

## 2023-03-02 NOTE — Progress Notes (Addendum)
    SUBJECTIVE:   CHIEF COMPLAINT / HPI:   STD check Has had 1-2 weeks of vaginal odor that smells fleshy. Never had before. Has also had spotting/start of a period, though her last period ended at the end of June. She had one episode of severe cramping where she felt a loss of mucus, though it just appeared to be clear. She is sexually active with 2 female partners. No birth control or condoms.  PERTINENT  PMH / PSH: asthma, acne, PPROM, complete miscarriage  OBJECTIVE:   BP 113/74   Pulse (!) 111   Ht 5\' 3"  (1.6 m)   Wt 127 lb 6.4 oz (57.8 kg)   LMP 03/02/2023 (Exact Date)   SpO2 100%   BMI 22.57 kg/m   General: Alert and oriented, in NAD Skin: Warm, dry, and intact without lesions HEENT: NCAT, EOM grossly normal, midline nasal septum Cardiac: RRR, no m/r/g appreciated Respiratory: CTAB, breathing and speaking comfortably on RA Abdominal: Soft, mildly tender in suprapubic area, nondistended, normoactive bowel sounds Genitourinary: External vagina normal without appreciable lesions, internal vaginal walls unremarkable aside from pooling of moderate blood, blood at cervical os, no white or yellow discharge noted Extremities: Moves all extremities grossly equally Neurological: No gross focal deficit Psychiatric: Appropriate mood and affect   ASSESSMENT/PLAN:   Bacterial vaginosis, STD screen History, exam, and studies positive for BV. GC/chlamydia/HIV/RPR pending. Pregnancy test negative. Patient has allergy to corn-containing products which may be present in metronidazole; discussed with patient who states she does get itchy but has never had anaphylaxis. Out of an abundance of caution, advised to keep benadryl on hand while taking this in case of itchiness/discomfort and will send in new epipen to have in rare case of anaphylaxis. Patient is comfortable with this plan. Follow remaining studies. Counseled on safer sex practices. Patient declines contraception at this time; she will  contact us if interested in the future.   Health maintenance Consider discussion around updated vaccines at next visit with PCP.  Janeal Holmes, MD [facility]

## 2023-03-03 ENCOUNTER — Encounter: Payer: Self-pay | Admitting: Family Medicine

## 2023-03-03 MED ORDER — EPINEPHRINE 0.3 MG/0.3ML IJ SOAJ: 0.3000 mg | INTRAMUSCULAR | 1 refills | Status: AC | PRN

## 2023-03-03 MED ORDER — METRONIDAZOLE 500 MG PO TABS: 500.0000 mg | ORAL_TABLET | Freq: Two times a day (BID) | ORAL | 0 refills | Status: AC

## 2023-03-03 NOTE — Addendum Note (Signed)
Addended by: Evette Georges B on: 03/03/2023 09:54 AM   Modules accepted: Orders

## 2023-03-05 LAB — CERVICOVAGINAL ANCILLARY ONLY
Chlamydia: NEGATIVE
Comment: NEGATIVE
Comment: NORMAL
Neisseria Gonorrhea: NEGATIVE

## 2023-03-06 LAB — TREPONEMAL ANTIBODIES, TPPA: Treponemal Antibodies, TPPA: NONREACTIVE

## 2023-03-06 LAB — HIV ANTIBODY (ROUTINE TESTING W REFLEX): HIV Screen 4th Generation wRfx: NONREACTIVE

## 2023-03-06 LAB — RPR W/REFLEX TO TREPSURE: RPR: NONREACTIVE

## 2023-09-21 ENCOUNTER — Ambulatory Visit: Payer: Medicaid Other | Admitting: Family Medicine

## 2023-12-26 ENCOUNTER — Other Ambulatory Visit: Payer: Self-pay | Admitting: Obstetrics and Gynecology

## 2023-12-26 DIAGNOSIS — Z3A14 14 weeks gestation of pregnancy: Secondary | ICD-10-CM | POA: Diagnosis not present

## 2023-12-26 DIAGNOSIS — Z369 Encounter for antenatal screening, unspecified: Secondary | ICD-10-CM | POA: Diagnosis not present

## 2023-12-26 DIAGNOSIS — Z3481 Encounter for supervision of other normal pregnancy, first trimester: Secondary | ICD-10-CM | POA: Diagnosis not present

## 2023-12-26 DIAGNOSIS — Z113 Encounter for screening for infections with a predominantly sexual mode of transmission: Secondary | ICD-10-CM | POA: Diagnosis not present

## 2023-12-26 DIAGNOSIS — N883 Incompetence of cervix uteri: Secondary | ICD-10-CM | POA: Diagnosis not present

## 2023-12-26 DIAGNOSIS — O3680X9 Pregnancy with inconclusive fetal viability, other fetus: Secondary | ICD-10-CM | POA: Diagnosis not present

## 2023-12-26 DIAGNOSIS — Z331 Pregnant state, incidental: Secondary | ICD-10-CM | POA: Diagnosis not present

## 2023-12-26 LAB — OB RESULTS CONSOLE RPR: RPR: NONREACTIVE

## 2023-12-26 LAB — OB RESULTS CONSOLE HEPATITIS B SURFACE ANTIGEN: Hepatitis B Surface Ag: NEGATIVE

## 2023-12-26 LAB — OB RESULTS CONSOLE RUBELLA ANTIBODY, IGM: Rubella: IMMUNE

## 2023-12-26 LAB — OB RESULTS CONSOLE HIV ANTIBODY (ROUTINE TESTING): HIV: NONREACTIVE

## 2023-12-26 LAB — HEPATITIS C ANTIBODY: HCV Ab: NEGATIVE

## 2023-12-27 ENCOUNTER — Telehealth (HOSPITAL_COMMUNITY): Payer: Self-pay | Admitting: *Deleted

## 2023-12-27 ENCOUNTER — Encounter (HOSPITAL_COMMUNITY): Payer: Self-pay | Admitting: Obstetrics and Gynecology

## 2023-12-27 DIAGNOSIS — O09299 Supervision of pregnancy with other poor reproductive or obstetric history, unspecified trimester: Secondary | ICD-10-CM

## 2023-12-27 DIAGNOSIS — Z8742 Personal history of other diseases of the female genital tract: Secondary | ICD-10-CM

## 2023-12-27 NOTE — Telephone Encounter (Signed)
  Pt called at 1025 on 12/27/23 for preadmission instructions. No answer at this time, message left to return call.

## 2023-12-27 NOTE — Anesthesia Preprocedure Evaluation (Addendum)
 Anesthesia Evaluation  Patient identified by MRN, date of birth, ID band Patient awake    Reviewed: Allergy & Precautions, H&P , NPO status , Patient's Chart, lab work & pertinent test results  Airway Mallampati: II  TM Distance: >3 FB Neck ROM: Full    Dental no notable dental hx.    Pulmonary neg pulmonary ROS, asthma , former smoker   Pulmonary exam normal breath sounds clear to auscultation       Cardiovascular negative cardio ROS Normal cardiovascular exam Rhythm:Regular Rate:Normal     Neuro/Psych   Anxiety     negative neurological ROS  negative psych ROS   GI/Hepatic negative GI ROS, Neg liver ROS,,,  Endo/Other  negative endocrine ROS    Renal/GU negative Renal ROS  negative genitourinary   Musculoskeletal negative musculoskeletal ROS (+)    Abdominal   Peds negative pediatric ROS (+)  Hematology negative hematology ROS (+)   Anesthesia Other Findings   Reproductive/Obstetrics negative OB ROS                             Anesthesia Physical Anesthesia Plan  ASA: 2  Anesthesia Plan: Spinal   Post-op Pain Management: Minimal or no pain anticipated   Induction: Intravenous  PONV Risk Score and Plan: 2 and Ondansetron  and Treatment may vary due to age or medical condition  Airway Management Planned: Natural Airway, Simple Face Mask and Nasal Cannula  Additional Equipment: Fetal Monitoring and None  Intra-op Plan:   Post-operative Plan:   Informed Consent:   Plan Discussed with: Anesthesiologist and CRNA  Anesthesia Plan Comments: (  )       Anesthesia Quick Evaluation

## 2023-12-27 NOTE — H&P (Signed)
 Amanda Vincent is a 23 y.o. female, G4P0030 at 59 6/7 weeks, presenting on 12/28/23 for prophylactic cerclage.    Patient seen at [facility] 5/7 for NOB w/u, reported hx of 2 prior pregnancy losses with PPROM and delivery at 50 and 18 4/7 weeks in 2021 and 2022, respectively, with features of 2nd loss c/w incompetent cervix.  She was advised after the last loss that she would need a cerclage with subsequent pregnancies.  At her NOB w/u on 5/8, pregnancy was viable, dating confirmed by US  due to irregular cycles, with cervical length 4 cm.  Dr. Mills Alma was consulted, patient was agreeable to cerclage, and procedure was scheduled for 12/28/23.  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   EDC of 06/07/24 was established by US  at 16 4/7 weeks due to irregular cycles.   US  evaluations:  12/26/23--SIUP, 16 4/7 weeks, FHR 156, female, posterior placenta, AFV WNL, cervical length 4 cm, good FM noted..   Significant prenatal events:  Hx irregular cycles, desired cerclage. NOB labs pending from 5/7, with GC/chlamydia also pending.    Last evaluation:  12/26/23  OB History     Gravida  4   Para  0   Term  0   Preterm  0   AB  3   Living  0      SAB  3   IAB  0   Ectopic  0   Multiple  0   Live Births  0         2021--Loss at 18 5/7 weeks, dx with previable PROM, delivered 4 days later. 2022--PPROM at 18 5/7 weeks with cervical dilation, delivered same day 2023--SAB around 6 weeks  Past Medical History:  Diagnosis Date   Acne    Anxiety    Asthma    Bacterial vaginosis    Past Surgical History:  Procedure Laterality Date   NO PAST SURGERIES     Family History: family history includes Healthy in her father and mother.  Social History:  reports  that she has quit smoking. Her smoking use included cigars. She has never used smokeless tobacco. She reports that she does not currently use alcohol. She reports current drug use. Drug: Marijuana.   ROS:  Denies leaking, bleeding, or cramping, no other sx reported.  Allergies  Allergen Reactions   Corn-Containing Products    Dog Epithelium    Mold Extract [Trichophyton]    Peanut-Containing Drug Products    Sesame Seed (Diagnostic)        Last menstrual period 03/02/2023, unknown if currently breastfeeding.  Chest clear Heart RRR without murmur Abd gravid, NT, FH 16 weeks Pelvic: Cervix closed on exam 12/26/23 Ext: WNL  FHR: FHR 156 on 12/26/23   Blood type O+ from prior EPIC records Prenatal labs pending from 12/26/23    Assessment/Plan: IUP at 16 6/7 weeks Hx two 2nd trimester PPROM/? Incompetent cervix losses. B+ blood type  Plan: [facility] with Dr. Mills Alma. F/u per Dr. Chandra Come plan.  Havery Lions CNM, [place] 12/27/2023, 6:29 AM

## 2023-12-27 NOTE — Telephone Encounter (Signed)
 Preadmission screen Pt to arrive at 0945 on 12/28/23 for cerclage scheduled at 1145 Nothing to eat after midnight and clear liquids up until 0945. All questions answered and pt verbalized understanding.

## 2023-12-28 ENCOUNTER — Other Ambulatory Visit: Payer: Self-pay

## 2023-12-28 ENCOUNTER — Ambulatory Visit (HOSPITAL_COMMUNITY): Payer: Self-pay | Admitting: Anesthesiology

## 2023-12-28 ENCOUNTER — Encounter (HOSPITAL_COMMUNITY): Payer: Self-pay | Admitting: Obstetrics and Gynecology

## 2023-12-28 ENCOUNTER — Encounter (HOSPITAL_COMMUNITY)
Admission: RE | Disposition: A | Discharge status: Home / Self Care | Payer: Self-pay | Source: Home / Self Care | Attending: Obstetrics and Gynecology

## 2023-12-28 ENCOUNTER — Ambulatory Visit (HOSPITAL_COMMUNITY)
Admission: RE | Admit: 2023-12-28 | Discharge: 2023-12-28 | Disposition: A | Discharge status: Home / Self Care | Attending: Obstetrics and Gynecology | Admitting: Obstetrics and Gynecology

## 2023-12-28 DIAGNOSIS — J45909 Unspecified asthma, uncomplicated: Secondary | ICD-10-CM | POA: Insufficient documentation

## 2023-12-28 DIAGNOSIS — O3432 Maternal care for cervical incompetence, second trimester: Secondary | ICD-10-CM

## 2023-12-28 DIAGNOSIS — Z3A16 16 weeks gestation of pregnancy: Secondary | ICD-10-CM

## 2023-12-28 DIAGNOSIS — Z8742 Personal history of other diseases of the female genital tract: Secondary | ICD-10-CM

## 2023-12-28 DIAGNOSIS — O99512 Diseases of the respiratory system complicating pregnancy, second trimester: Secondary | ICD-10-CM | POA: Insufficient documentation

## 2023-12-28 DIAGNOSIS — Z87891 Personal history of nicotine dependence: Secondary | ICD-10-CM | POA: Insufficient documentation

## 2023-12-28 DIAGNOSIS — N883 Incompetence of cervix uteri: Secondary | ICD-10-CM | POA: Diagnosis not present

## 2023-12-28 DIAGNOSIS — O09299 Supervision of pregnancy with other poor reproductive or obstetric history, unspecified trimester: Secondary | ICD-10-CM

## 2023-12-28 HISTORY — PX: CERVICAL CERCLAGE: SHX1329

## 2023-12-28 LAB — CBC
HCT: 28.4 % — ABNORMAL LOW (ref 36.0–46.0)
Hemoglobin: 10.3 g/dL — ABNORMAL LOW (ref 12.0–15.0)
MCH: 29.7 pg (ref 26.0–34.0)
MCHC: 36.3 g/dL — ABNORMAL HIGH (ref 30.0–36.0)
MCV: 81.8 fL (ref 80.0–100.0)
Platelets: 242 10*3/uL (ref 150–400)
RBC: 3.47 MIL/uL — ABNORMAL LOW (ref 3.87–5.11)
RDW: 12.7 % (ref 11.5–15.5)
WBC: 7.9 10*3/uL (ref 4.0–10.5)
nRBC: 0 % (ref 0.0–0.2)

## 2023-12-28 LAB — TYPE AND SCREEN
ABO/RH(D): O POS
Antibody Screen: NEGATIVE

## 2023-12-28 SURGERY — CERCLAGE, CERVIX, VAGINAL APPROACH
Anesthesia: Spinal

## 2023-12-28 MED ORDER — FENTANYL CITRATE (PF) 100 MCG/2ML IJ SOLN: 25.0000 ug | INTRAMUSCULAR | Status: DC | PRN

## 2023-12-28 MED ORDER — ONDANSETRON HCL 4 MG/2ML IJ SOLN
INTRAMUSCULAR | Status: AC
  Filled 2023-12-28: qty 2

## 2023-12-28 MED ORDER — OXYCODONE HCL 5 MG/5ML PO SOLN: 5.0000 mg | Freq: Once | ORAL | Status: DC | PRN

## 2023-12-28 MED ORDER — STERILE WATER FOR IRRIGATION IR SOLN
Status: DC | PRN
  Administered 2023-12-28: 1000 mL

## 2023-12-28 MED ORDER — SODIUM CHLORIDE (PF) 0.9 % IJ SOLN
INTRAMUSCULAR | Status: DC | PRN
  Administered 2023-12-28: 30 mL via VAGINAL

## 2023-12-28 MED ORDER — CLINDAMYCIN PHOSPHATE 300 MG/2ML IJ SOLN
Freq: Once | INTRAMUSCULAR | Status: DC
  Filled 2023-12-28: qty 1

## 2023-12-28 MED ORDER — SODIUM CHLORIDE (PF) 0.9 % IJ SOLN
INTRAMUSCULAR | Status: AC
  Filled 2023-12-28: qty 10

## 2023-12-28 MED ORDER — INDOMETHACIN 25 MG PO CAPS
25.0000 mg | ORAL_CAPSULE | Freq: Four times a day (QID) | ORAL | 0 refills | Status: AC
Start: 2023-12-28 — End: 2023-12-29

## 2023-12-28 MED ORDER — FENTANYL CITRATE (PF) 250 MCG/5ML IJ SOLN
INTRAMUSCULAR | Status: DC | PRN
  Administered 2023-12-28: 25 ug via INTRATHECAL

## 2023-12-28 MED ORDER — CHLOROPROCAINE HCL (PF) 3 % IJ SOLN
INTRAMUSCULAR | Status: AC
  Filled 2023-12-28: qty 20

## 2023-12-28 MED ORDER — LACTATED RINGERS IV SOLN: INTRAVENOUS | Status: DC

## 2023-12-28 MED ORDER — ONDANSETRON HCL 4 MG/2ML IJ SOLN
4.0000 mg | Freq: Once | INTRAMUSCULAR | Status: AC | PRN
Start: 2023-12-28 — End: 2023-12-28
  Administered 2023-12-28: 4 mg via INTRAVENOUS

## 2023-12-28 MED ORDER — SODIUM CHLORIDE (PF) 0.9 % IJ SOLN
Freq: Once | INTRAMUSCULAR | Status: DC
  Filled 2023-12-28: qty 1

## 2023-12-28 MED ORDER — OXYCODONE HCL 5 MG PO TABS: 5.0000 mg | ORAL_TABLET | Freq: Once | ORAL | Status: DC | PRN

## 2023-12-28 MED ORDER — CHLOROPROCAINE HCL (PF) 3 % IJ SOLN
INTRAMUSCULAR | Status: DC | PRN
  Administered 2023-12-28: 1.6 mL via EPIDURAL

## 2023-12-28 MED ORDER — FENTANYL CITRATE (PF) 100 MCG/2ML IJ SOLN
INTRAMUSCULAR | Status: AC
  Filled 2023-12-28: qty 2

## 2023-12-28 MED ORDER — FENTANYL CITRATE (PF) 250 MCG/5ML IJ SOLN: INTRAMUSCULAR | Status: DC | PRN

## 2023-12-28 MED ORDER — CHLOROPROCAINE HCL (PF) 3 % IJ SOLN: INTRAMUSCULAR | Status: DC | PRN

## 2023-12-28 MED ORDER — MEPERIDINE HCL 25 MG/ML IJ SOLN: 6.2500 mg | INTRAMUSCULAR | Status: DC | PRN

## 2023-12-28 SURGICAL SUPPLY — 15 items
CANISTER SUCT 3000ML PPV (MISCELLANEOUS) ×1 IMPLANT
GLOVE BIO SURGEON STRL SZ 6.5 (GLOVE) ×1 IMPLANT
GLOVE BIOGEL PI IND STRL 7.0 (GLOVE) ×1 IMPLANT
GOWN STRL REUS W/TWL LRG LVL3 (GOWN DISPOSABLE) ×2 IMPLANT
PACK VAGINAL MINOR WOMEN LF (CUSTOM PROCEDURE TRAY) ×1 IMPLANT
PAD OB MATERNITY 4.3X12.25 (PERSONAL CARE ITEMS) ×1 IMPLANT
PAD PREP 24X48 CUFFED NSTRL (MISCELLANEOUS) ×1 IMPLANT
SCOPETTES 8 STERILE (MISCELLANEOUS) IMPLANT
SET BERKELEY SUCTION TUBING (SUCTIONS) IMPLANT
SUT MERSILENE 5MM BP 1 12 (SUTURE) ×1 IMPLANT
SYR BULB IRRIGATION 50ML (SYRINGE) ×1 IMPLANT
TOWEL OR 17X24 6PK STRL BLUE (TOWEL DISPOSABLE) ×2 IMPLANT
TRAY FOLEY W/BAG SLVR 14FR (SET/KITS/TRAYS/PACK) ×1 IMPLANT
TUBING NON-CON 1/4 X 20 CONN (TUBING) ×1 IMPLANT
YANKAUER SUCT BULB TIP NO VENT (SUCTIONS) ×1 IMPLANT

## 2023-12-28 NOTE — Anesthesia Postprocedure Evaluation (Signed)
 Anesthesia Post Note  Patient: Amanda Vincent  Procedure(s) Performed: CERCLAGE, CERVIX, VAGINAL APPROACH     Patient location during evaluation: PACU Anesthesia Type: Spinal Level of consciousness: oriented and awake and alert Pain management: pain level controlled Vital Signs Assessment: post-procedure vital signs reviewed and stable Respiratory status: spontaneous breathing, respiratory function stable and patient connected to nasal cannula oxygen Cardiovascular status: blood pressure returned to baseline and stable Postop Assessment: no headache, no backache and no apparent nausea or vomiting Anesthetic complications: no   No notable events documented.  Last Vitals:  Vitals:   12/28/23 1315 12/28/23 1330  BP: (!) 112/57 117/74  Pulse: 93 84  Resp: (!) 22 20  Temp:    SpO2: 96% 100%    Last Pain:  Vitals:   12/28/23 1253  TempSrc: Oral   Pain Goal:                   Allisa Einspahr

## 2023-12-28 NOTE — Anesthesia Procedure Notes (Signed)
 Spinal  Patient location during procedure: OR Start time: 12/28/2023 12:06 PM End time: 12/28/2023 12:10 PM Reason for block: surgical anesthesia Staffing Anesthesiologist: Rhenda Cedars, MD Performed by: Rhenda Cedars, MD Authorized by: Rhenda Cedars, MD   Preanesthetic Checklist Completed: patient identified, IV checked, site marked, risks and benefits discussed, surgical consent, monitors and equipment checked, pre-op evaluation and timeout performed Spinal Block Patient position: sitting Prep: DuraPrep Patient monitoring: heart rate, cardiac monitor, continuous pulse ox and blood pressure Approach: midline Location: L4-5 Injection technique: single-shot Needle Needle type: Sprotte  Needle gauge: 24 G Needle length: 9 cm Assessment Sensory level: T4 Events: CSF return

## 2023-12-28 NOTE — OR Nursing (Signed)
 Removed pt iv and foley catheter pt able to ambulate no problems to bathroom discharge instructions given

## 2023-12-28 NOTE — Op Note (Signed)
 Pre op DX incompetent cervix  Post op DX same   procedure: CERCLAGE CERVICAL   Anesthesia: Spinal   Anesthesiologist: No responsible provider has been recorded for the case.   Attending: Marylu Soda, MD   Assistant:none  Findings: cx about 2-3 cm in length and soft  Pathology:none  Fluids: 1000cc  UOP: 200 cc  EBL: minimal  Complications: none  Procedure: The patient was taken to the operating room after the risks benefits and alternatives were discussed with the patient, the patient verbalized understanding and consent signed and witnessed.  The patient was given spinal anesthesia which was tested and adequate.  She was placed in dorsal lithotomy and prepped and draped.  Foley catheter placed.   A weighted speculum was placed in the posterior fourchette. deavers were placed in the vagina to give visualization.  Two ring forceps were placed on the cervix.  A [employer] cervical cerclage was placed with mesiline suture. It was tied with the knot at 12 o clock.   Clindamycin  douche was then used.  Hemostasis was noted.  All instruments were removed. The count was correct. The patient was transferred to the recovery room in good condition.

## 2023-12-28 NOTE — Transfer of Care (Signed)
 Immediate Anesthesia Transfer of Care Note  Patient: Amanda Vincent  Procedure(s) Performed: CERCLAGE, CERVIX, VAGINAL APPROACH  Patient Location: PACU  Anesthesia Type:Spinal  Level of Consciousness: awake, alert , and oriented  Airway & Oxygen Therapy: Patient Spontanous Breathing  Post-op Assessment: Report given to RN and Post -op Vital signs reviewed and stable  Post vital signs: Reviewed and stable  Last Vitals:  Vitals Value Taken Time  BP 107/51 12/28/23 1254  Temp 36.9 C 12/28/23 1253  Pulse 80 12/28/23 1257  Resp 26 12/28/23 1257  SpO2 99 % 12/28/23 1257  Vitals shown include unfiled device data.  Last Pain:  Vitals:   12/28/23 1253  TempSrc: Oral         Complications: No notable events documented.

## 2023-12-31 ENCOUNTER — Encounter (HOSPITAL_COMMUNITY): Payer: Self-pay | Admitting: Obstetrics and Gynecology

## 2024-01-17 DIAGNOSIS — Z363 Encounter for antenatal screening for malformations: Secondary | ICD-10-CM | POA: Diagnosis not present

## 2024-01-28 ENCOUNTER — Other Ambulatory Visit: Payer: Self-pay | Admitting: Obstetrics and Gynecology

## 2024-01-28 DIAGNOSIS — O365990 Maternal care for other known or suspected poor fetal growth, unspecified trimester, not applicable or unspecified: Secondary | ICD-10-CM

## 2024-02-06 DIAGNOSIS — O365920 Maternal care for other known or suspected poor fetal growth, second trimester, not applicable or unspecified: Secondary | ICD-10-CM | POA: Insufficient documentation

## 2024-02-06 DIAGNOSIS — D582 Other hemoglobinopathies: Secondary | ICD-10-CM | POA: Insufficient documentation

## 2024-02-06 DIAGNOSIS — O3430 Maternal care for cervical incompetence, unspecified trimester: Secondary | ICD-10-CM | POA: Insufficient documentation

## 2024-02-06 DIAGNOSIS — O99320 Drug use complicating pregnancy, unspecified trimester: Secondary | ICD-10-CM | POA: Insufficient documentation

## 2024-02-12 DIAGNOSIS — O365990 Maternal care for other known or suspected poor fetal growth, unspecified trimester, not applicable or unspecified: Secondary | ICD-10-CM | POA: Diagnosis not present

## 2024-02-18 ENCOUNTER — Other Ambulatory Visit: Payer: Self-pay | Admitting: *Deleted

## 2024-02-18 ENCOUNTER — Other Ambulatory Visit: Payer: Self-pay | Admitting: Obstetrics and Gynecology

## 2024-02-18 ENCOUNTER — Ambulatory Visit: Attending: Obstetrics and Gynecology | Admitting: Obstetrics

## 2024-02-18 ENCOUNTER — Ambulatory Visit (HOSPITAL_BASED_OUTPATIENT_CLINIC_OR_DEPARTMENT_OTHER)

## 2024-02-18 VITALS — BP 102/57 | HR 71

## 2024-02-18 DIAGNOSIS — O09299 Supervision of pregnancy with other poor reproductive or obstetric history, unspecified trimester: Secondary | ICD-10-CM

## 2024-02-18 DIAGNOSIS — O99320 Drug use complicating pregnancy, unspecified trimester: Secondary | ICD-10-CM

## 2024-02-18 DIAGNOSIS — O3432 Maternal care for cervical incompetence, second trimester: Secondary | ICD-10-CM | POA: Insufficient documentation

## 2024-02-18 DIAGNOSIS — O365990 Maternal care for other known or suspected poor fetal growth, unspecified trimester, not applicable or unspecified: Secondary | ICD-10-CM

## 2024-02-18 DIAGNOSIS — D582 Other hemoglobinopathies: Secondary | ICD-10-CM

## 2024-02-18 DIAGNOSIS — Z3A24 24 weeks gestation of pregnancy: Secondary | ICD-10-CM | POA: Diagnosis not present

## 2024-02-18 DIAGNOSIS — O99322 Drug use complicating pregnancy, second trimester: Secondary | ICD-10-CM | POA: Diagnosis not present

## 2024-02-18 DIAGNOSIS — O365920 Maternal care for other known or suspected poor fetal growth, second trimester, not applicable or unspecified: Secondary | ICD-10-CM | POA: Insufficient documentation

## 2024-02-18 NOTE — Progress Notes (Signed)
 MFM Consult Note  Amanda Vincent is currently at 24 weeks and 2 days.  She was seen due to IUGR noted on a recent ultrasound performed in your office.   She currently has a cervical cerclage in place due to her history of two prior 18+ week losses.    The patient reports that her [facility] of June 07, 2024 was based on a 14-week ultrasound performed in your office.  She denies any significant past medical history and denies any problems in her current pregnancy.    She had a cell free DNA test earlier in her pregnancy which indicated a low risk for trisomy 53, 34, and 13. A female fetus is predicted.   On today's exam, the overall EFW of 1 pound 3 ounces measures at the 3rd percentile for her gestational age indicating IUGR.    There was normal amniotic fluid along with vigorous fetal movements noted throughout today's exam.  There were no obvious fetal anomalies noted on today's ultrasound exam.  The patient was informed that anomalies may be missed due to technical limitations. If the fetus is in a suboptimal position or maternal habitus is increased, visualization of the fetus in the maternal uterus may be impaired.  Doppler studies of the umbilical arteries performed today due to IUGR showed an elevated S/D ratio of 5.34.  There were no signs of absent or reversed end-diastolic flow.  The patient was reassured that IUGR is a common finding.  Most cases of IUGR result in the delivery of a healthy infant at or close to term.  Due to IUGR, she will return in 2 weeks for an umbilical artery Doppler study and amniotic fluid check.  We will reassess the fetal growth in 3 weeks.    Should IUGR continue to be noted later in her pregnancy, as long as the weekly fetal testing and umbilical artery Doppler studies are within normal limits, delivery will be recommended at around 37 weeks.    The patient stated that all of her questions were answered today.  A total of 45 minutes was spent  counseling and coordinating the care for this patient.  Greater than 50% of the time was spent in direct face-to-face contact.

## 2024-03-19 ENCOUNTER — Ambulatory Visit

## 2024-03-19 ENCOUNTER — Ambulatory Visit: Admitting: *Deleted

## 2024-03-19 ENCOUNTER — Ambulatory Visit: Attending: Obstetrics | Admitting: Obstetrics and Gynecology

## 2024-03-19 ENCOUNTER — Other Ambulatory Visit: Payer: Self-pay | Admitting: *Deleted

## 2024-03-19 ENCOUNTER — Other Ambulatory Visit: Payer: Self-pay | Admitting: Obstetrics

## 2024-03-19 DIAGNOSIS — O3432 Maternal care for cervical incompetence, second trimester: Secondary | ICD-10-CM

## 2024-03-19 DIAGNOSIS — O365920 Maternal care for other known or suspected poor fetal growth, second trimester, not applicable or unspecified: Secondary | ICD-10-CM

## 2024-03-19 DIAGNOSIS — Z3A28 28 weeks gestation of pregnancy: Secondary | ICD-10-CM | POA: Diagnosis not present

## 2024-03-19 DIAGNOSIS — F1290 Cannabis use, unspecified, uncomplicated: Secondary | ICD-10-CM | POA: Diagnosis not present

## 2024-03-19 DIAGNOSIS — O99322 Drug use complicating pregnancy, second trimester: Secondary | ICD-10-CM | POA: Diagnosis not present

## 2024-03-19 DIAGNOSIS — O365930 Maternal care for other known or suspected poor fetal growth, third trimester, not applicable or unspecified: Secondary | ICD-10-CM

## 2024-03-19 DIAGNOSIS — O09299 Supervision of pregnancy with other poor reproductive or obstetric history, unspecified trimester: Secondary | ICD-10-CM

## 2024-03-19 NOTE — Procedures (Signed)
 Amanda Vincent 2000/12/28 [redacted]w[redacted]d  Fetus A Non-Stress Test Interpretation for 03/19/24  Indication: IUGR  Fetal Heart Rate A Mode: External Baseline Rate (A): 135 bpm Variability: Moderate Accelerations: 10 x 10 Decelerations: None Multiple birth?: No  Uterine Activity Mode: Toco Contraction Frequency (min): Irregular Contraction Duration (sec): 30-60 Contraction Quality: Mild Resting Tone Palpated: Relaxed Resting Time: Adequate  Interpretation (Fetal Testing) Nonstress Test Interpretation: Reactive Comments: Tracing reviewed by Dr. Arna

## 2024-03-19 NOTE — Progress Notes (Signed)
 After review, MFM consult with provider is not indicated for today  Amanda Ranks, MD 03/19/2024 5:03 PM  [facility]

## 2024-03-24 DIAGNOSIS — Z369 Encounter for antenatal screening, unspecified: Secondary | ICD-10-CM | POA: Diagnosis not present

## 2024-04-03 ENCOUNTER — Ambulatory Visit (HOSPITAL_BASED_OUTPATIENT_CLINIC_OR_DEPARTMENT_OTHER): Admitting: Obstetrics and Gynecology

## 2024-04-03 ENCOUNTER — Ambulatory Visit (HOSPITAL_BASED_OUTPATIENT_CLINIC_OR_DEPARTMENT_OTHER)

## 2024-04-03 ENCOUNTER — Ambulatory Visit: Attending: Obstetrics and Gynecology

## 2024-04-03 VITALS — BP 112/62 | HR 83

## 2024-04-03 DIAGNOSIS — O3433 Maternal care for cervical incompetence, third trimester: Secondary | ICD-10-CM

## 2024-04-03 DIAGNOSIS — O365920 Maternal care for other known or suspected poor fetal growth, second trimester, not applicable or unspecified: Secondary | ICD-10-CM | POA: Insufficient documentation

## 2024-04-03 DIAGNOSIS — D582 Other hemoglobinopathies: Secondary | ICD-10-CM | POA: Insufficient documentation

## 2024-04-03 DIAGNOSIS — Z3A30 30 weeks gestation of pregnancy: Secondary | ICD-10-CM

## 2024-04-03 DIAGNOSIS — O09299 Supervision of pregnancy with other poor reproductive or obstetric history, unspecified trimester: Secondary | ICD-10-CM

## 2024-04-03 DIAGNOSIS — O3432 Maternal care for cervical incompetence, second trimester: Secondary | ICD-10-CM

## 2024-04-03 DIAGNOSIS — O365930 Maternal care for other known or suspected poor fetal growth, third trimester, not applicable or unspecified: Secondary | ICD-10-CM

## 2024-04-03 NOTE — Procedures (Signed)
 Amanda Vincent [birth year] [redacted]w[redacted]d  Fetus A Non-Stress Test Interpretation for 04/03/24  Indication: IUGR  Fetal Heart Rate A Mode: External Baseline Rate (A): 140 bpm Variability: Moderate Accelerations: 15 x 15 Decelerations: None Multiple birth?: No  Uterine Activity Mode: Palpation, Toco Contraction Frequency (min): none noted Resting Tone Palpated: Relaxed  Interpretation (Fetal Testing) Nonstress Test Interpretation: Reactive Comments: Reviewed with Dr. Arna

## 2024-04-03 NOTE — Progress Notes (Signed)
 After review, MFM consult with provider is not indicated for today  Arna Ranks, MD 04/03/2024 3:49 PM  [facility]

## 2024-04-08 ENCOUNTER — Other Ambulatory Visit: Payer: Self-pay

## 2024-04-08 ENCOUNTER — Other Ambulatory Visit: Payer: Self-pay | Admitting: Obstetrics and Gynecology

## 2024-04-08 ENCOUNTER — Ambulatory Visit (HOSPITAL_BASED_OUTPATIENT_CLINIC_OR_DEPARTMENT_OTHER): Admitting: Obstetrics and Gynecology

## 2024-04-08 ENCOUNTER — Ambulatory Visit: Attending: Obstetrics and Gynecology

## 2024-04-08 VITALS — BP 108/69 | HR 69

## 2024-04-08 DIAGNOSIS — O365930 Maternal care for other known or suspected poor fetal growth, third trimester, not applicable or unspecified: Secondary | ICD-10-CM | POA: Diagnosis not present

## 2024-04-08 DIAGNOSIS — O3433 Maternal care for cervical incompetence, third trimester: Secondary | ICD-10-CM

## 2024-04-08 DIAGNOSIS — O365920 Maternal care for other known or suspected poor fetal growth, second trimester, not applicable or unspecified: Secondary | ICD-10-CM | POA: Insufficient documentation

## 2024-04-08 DIAGNOSIS — Z3483 Encounter for supervision of other normal pregnancy, third trimester: Secondary | ICD-10-CM

## 2024-04-08 DIAGNOSIS — F1210 Cannabis abuse, uncomplicated: Secondary | ICD-10-CM | POA: Diagnosis not present

## 2024-04-08 DIAGNOSIS — O99322 Drug use complicating pregnancy, second trimester: Secondary | ICD-10-CM | POA: Diagnosis not present

## 2024-04-08 DIAGNOSIS — Z3A31 31 weeks gestation of pregnancy: Secondary | ICD-10-CM | POA: Insufficient documentation

## 2024-04-08 NOTE — Progress Notes (Signed)
 After review, MFM consult with provider is not indicated for today  Arna Ranks, MD 04/08/2024 4:42 PM  [facility]

## 2024-04-16 ENCOUNTER — Ambulatory Visit (HOSPITAL_BASED_OUTPATIENT_CLINIC_OR_DEPARTMENT_OTHER): Admitting: Obstetrics and Gynecology

## 2024-04-16 ENCOUNTER — Ambulatory Visit: Attending: Obstetrics and Gynecology

## 2024-04-16 VITALS — BP 107/55 | HR 75

## 2024-04-16 DIAGNOSIS — Z3A32 32 weeks gestation of pregnancy: Secondary | ICD-10-CM | POA: Insufficient documentation

## 2024-04-16 DIAGNOSIS — O365930 Maternal care for other known or suspected poor fetal growth, third trimester, not applicable or unspecified: Secondary | ICD-10-CM

## 2024-04-16 DIAGNOSIS — O3432 Maternal care for cervical incompetence, second trimester: Secondary | ICD-10-CM | POA: Diagnosis not present

## 2024-04-16 DIAGNOSIS — O365920 Maternal care for other known or suspected poor fetal growth, second trimester, not applicable or unspecified: Secondary | ICD-10-CM | POA: Diagnosis not present

## 2024-04-16 NOTE — Progress Notes (Signed)
 After review, MFM consult with provider is not indicated for today  Amanda Ranks, MD 04/16/2024 4:15 PM  [facility]

## 2024-04-23 ENCOUNTER — Ambulatory Visit (HOSPITAL_BASED_OUTPATIENT_CLINIC_OR_DEPARTMENT_OTHER): Admitting: Obstetrics and Gynecology

## 2024-04-23 ENCOUNTER — Ambulatory Visit: Attending: Obstetrics and Gynecology

## 2024-04-23 ENCOUNTER — Other Ambulatory Visit: Payer: Self-pay | Admitting: *Deleted

## 2024-04-23 VITALS — BP 119/61 | HR 71

## 2024-04-23 DIAGNOSIS — Z3A33 33 weeks gestation of pregnancy: Secondary | ICD-10-CM | POA: Insufficient documentation

## 2024-04-23 DIAGNOSIS — O365920 Maternal care for other known or suspected poor fetal growth, second trimester, not applicable or unspecified: Secondary | ICD-10-CM

## 2024-04-23 DIAGNOSIS — O365930 Maternal care for other known or suspected poor fetal growth, third trimester, not applicable or unspecified: Secondary | ICD-10-CM

## 2024-04-23 DIAGNOSIS — O3432 Maternal care for cervical incompetence, second trimester: Secondary | ICD-10-CM

## 2024-04-23 NOTE — Progress Notes (Signed)
 After review, MFM consult with provider is not indicated for today  Amanda Ranks, MD 04/23/2024 5:45 PM  [facility]

## 2024-04-24 DIAGNOSIS — O99019 Anemia complicating pregnancy, unspecified trimester: Secondary | ICD-10-CM | POA: Diagnosis not present

## 2024-04-24 DIAGNOSIS — Z331 Pregnant state, incidental: Secondary | ICD-10-CM | POA: Diagnosis not present

## 2024-04-24 DIAGNOSIS — E559 Vitamin D deficiency, unspecified: Secondary | ICD-10-CM | POA: Diagnosis not present

## 2024-04-24 DIAGNOSIS — D582 Other hemoglobinopathies: Secondary | ICD-10-CM | POA: Diagnosis not present

## 2024-04-24 DIAGNOSIS — Z3A33 33 weeks gestation of pregnancy: Secondary | ICD-10-CM | POA: Diagnosis not present

## 2024-04-24 DIAGNOSIS — Z8742 Personal history of other diseases of the female genital tract: Secondary | ICD-10-CM | POA: Diagnosis not present

## 2024-04-24 DIAGNOSIS — O365990 Maternal care for other known or suspected poor fetal growth, unspecified trimester, not applicable or unspecified: Secondary | ICD-10-CM | POA: Diagnosis not present

## 2024-04-24 DIAGNOSIS — A6000 Herpesviral infection of urogenital system, unspecified: Secondary | ICD-10-CM | POA: Diagnosis not present

## 2024-04-24 DIAGNOSIS — R768 Other specified abnormal immunological findings in serum: Secondary | ICD-10-CM | POA: Diagnosis not present

## 2024-04-29 ENCOUNTER — Ambulatory Visit: Admitting: Obstetrics and Gynecology

## 2024-04-29 ENCOUNTER — Ambulatory Visit: Attending: Obstetrics and Gynecology

## 2024-04-29 VITALS — BP 118/67

## 2024-04-29 DIAGNOSIS — O365920 Maternal care for other known or suspected poor fetal growth, second trimester, not applicable or unspecified: Secondary | ICD-10-CM | POA: Insufficient documentation

## 2024-04-29 DIAGNOSIS — O3433 Maternal care for cervical incompetence, third trimester: Secondary | ICD-10-CM | POA: Diagnosis not present

## 2024-04-29 DIAGNOSIS — O365930 Maternal care for other known or suspected poor fetal growth, third trimester, not applicable or unspecified: Secondary | ICD-10-CM

## 2024-04-29 DIAGNOSIS — Z3A34 34 weeks gestation of pregnancy: Secondary | ICD-10-CM | POA: Diagnosis not present

## 2024-04-29 NOTE — Progress Notes (Signed)
 Maternal-Fetal Medicine Consultation  Name: Amanda Vincent  MRN: [id number]  GA: [id number] [redacted]w[redacted]d   Fetal growth restriction.  On ultrasound performed 3 weeks ago, the estimated fetal weight was at the 4th percentile and the abdominal circumference measurement at the 18th percentile.  She does not have hypertension.  Blood pressure today at our office is 118/67 mmHg.  Ultrasound On today's ultrasound, the estimated fetal weight is at the 6th percentile and the abdominal circumference measurement at the 28th percentile.  Femur length measurement is at the 1st percentile, but good interval growth is seen.  Interval weight gain is 566 g. Amniotic fluid is normal and good fetal activity seen.  Umbilical artery Doppler showed normal forward diastolic flow.  Antenatal testing is reassuring.  BPP 8/8.  Fetal growth restriction I reassured the patient of good fetal interval growth.  Findings are not consistent with severe fetal growth restriction. I discussed and recommended continuation of weekly antenatal testing.  We will discuss timing of delivery at the next fetal growth assessment in 3 weeks. If severe fetal growth restriction is seen we will recommend delivery at diagnosis.  If fetus is not severely growth restricted, patient can deliver at [redacted] weeks gestation. I discussed the importance of umbilical artery Doppler studies.  If umbilical artery Doppler studies are abnormal (shows increased S/D ratio or worse), patient should be delivered at 37 weeks.  Recommendations - Continue weekly antenatal testing till delivery. - Fetal growth assessment in 3 weeks and decide on timing of delivery.   Consultation including face-to-face (more than 50%) counseling 20 minutes.

## 2024-05-06 ENCOUNTER — Ambulatory Visit: Attending: Neurology

## 2024-05-06 ENCOUNTER — Ambulatory Visit (HOSPITAL_BASED_OUTPATIENT_CLINIC_OR_DEPARTMENT_OTHER): Admitting: Obstetrics and Gynecology

## 2024-05-06 VITALS — BP 134/71

## 2024-05-06 DIAGNOSIS — O3432 Maternal care for cervical incompetence, second trimester: Secondary | ICD-10-CM

## 2024-05-06 DIAGNOSIS — Z3A35 35 weeks gestation of pregnancy: Secondary | ICD-10-CM

## 2024-05-06 DIAGNOSIS — N883 Incompetence of cervix uteri: Secondary | ICD-10-CM | POA: Diagnosis not present

## 2024-05-06 DIAGNOSIS — Z113 Encounter for screening for infections with a predominantly sexual mode of transmission: Secondary | ICD-10-CM | POA: Diagnosis not present

## 2024-05-06 DIAGNOSIS — O365930 Maternal care for other known or suspected poor fetal growth, third trimester, not applicable or unspecified: Secondary | ICD-10-CM

## 2024-05-06 DIAGNOSIS — Z3483 Encounter for supervision of other normal pregnancy, third trimester: Secondary | ICD-10-CM

## 2024-05-06 DIAGNOSIS — O365920 Maternal care for other known or suspected poor fetal growth, second trimester, not applicable or unspecified: Secondary | ICD-10-CM | POA: Diagnosis present

## 2024-05-06 DIAGNOSIS — Z369 Encounter for antenatal screening, unspecified: Secondary | ICD-10-CM | POA: Diagnosis not present

## 2024-05-06 LAB — OB RESULTS CONSOLE GBS: GBS: POSITIVE

## 2024-05-06 NOTE — Progress Notes (Signed)
 After review, MFM consult with provider is not indicated for today  Arna Ranks, MD 05/06/2024 5:09 PM  [facility]

## 2024-05-14 ENCOUNTER — Inpatient Hospital Stay (HOSPITAL_COMMUNITY)
Admission: AD | Admit: 2024-05-14 | Discharge: 2024-05-14 | Disposition: A | Discharge status: Home / Self Care | Attending: Obstetrics and Gynecology | Admitting: Obstetrics and Gynecology

## 2024-05-14 ENCOUNTER — Encounter (HOSPITAL_COMMUNITY): Payer: Self-pay | Admitting: Obstetrics and Gynecology

## 2024-05-14 ENCOUNTER — Ambulatory Visit: Attending: Obstetrics and Gynecology

## 2024-05-14 ENCOUNTER — Ambulatory Visit (HOSPITAL_BASED_OUTPATIENT_CLINIC_OR_DEPARTMENT_OTHER): Admitting: Obstetrics and Gynecology

## 2024-05-14 ENCOUNTER — Other Ambulatory Visit: Payer: Self-pay

## 2024-05-14 VITALS — BP 110/63 | HR 77

## 2024-05-14 DIAGNOSIS — Z3A36 36 weeks gestation of pregnancy: Secondary | ICD-10-CM | POA: Insufficient documentation

## 2024-05-14 DIAGNOSIS — O3433 Maternal care for cervical incompetence, third trimester: Secondary | ICD-10-CM | POA: Diagnosis not present

## 2024-05-14 DIAGNOSIS — O479 False labor, unspecified: Secondary | ICD-10-CM

## 2024-05-14 DIAGNOSIS — O09299 Supervision of pregnancy with other poor reproductive or obstetric history, unspecified trimester: Secondary | ICD-10-CM | POA: Insufficient documentation

## 2024-05-14 DIAGNOSIS — O365930 Maternal care for other known or suspected poor fetal growth, third trimester, not applicable or unspecified: Secondary | ICD-10-CM

## 2024-05-14 DIAGNOSIS — O4703 False labor before 37 completed weeks of gestation, third trimester: Secondary | ICD-10-CM | POA: Diagnosis present

## 2024-05-14 DIAGNOSIS — O365920 Maternal care for other known or suspected poor fetal growth, second trimester, not applicable or unspecified: Secondary | ICD-10-CM

## 2024-05-14 MED ORDER — ACETAMINOPHEN 500 MG PO TABS
1000.0000 mg | ORAL_TABLET | Freq: Once | ORAL | Status: AC
  Administered 2024-05-14: 1000 mg via ORAL
  Filled 2024-05-14: qty 2

## 2024-05-14 NOTE — Progress Notes (Signed)
 Maternal-Fetal Medicine Consultation  Name: Amanda Vincent  MRN: [id number]  GA: [id number] [redacted]w[redacted]d   Fetal growth restriction.  Ultrasound Amniotic fluid is normal and good fetal activity is seen. Umbilical artery Doppler study showed normal forward diastolic flow. BPP 8/8.  We reassured the patient of the findings.   I informed the patient that after fetal growth assessment next week, we will recommend timing of delivery. Decision will be based on severity of fetal growth restriction.  Recommendations -Fetal growth assessment next week.     Consultation including face-to-face (more than 50%) counseling 10 minutes.

## 2024-05-14 NOTE — MAU Note (Addendum)
 Pt pulled straight to MAU exam room for urge to push - ctx started at 2300 and have been every 4-5 minutes. Denies VB or LOF. +FM. Pt also reports N/V - emesis x3 and HA.

## 2024-05-14 NOTE — MAU Provider Note (Signed)
 Amanda Vincent is a 23 y.o. G42P0030 female at [redacted]w[redacted]d  RN Labor check, not seen by provider SVE by RN: Dilation: 1.5 Effacement (%): Thick Station: [employer] Exam by:: [employer], RN NST: FHR baseline 135 bpm, Variability: moderate, Accelerations:present, Decelerations:  Absent= Cat 1/Reactive Toco: irregular  D/C home  Claris CHRISTELLA Cedar CNM, [facility] 05/14/2024 5:39 AM

## 2024-05-21 ENCOUNTER — Ambulatory Visit: Admitting: Obstetrics and Gynecology

## 2024-05-21 ENCOUNTER — Ambulatory Visit: Attending: Obstetrics and Gynecology

## 2024-05-21 VITALS — BP 120/68 | HR 82

## 2024-05-21 DIAGNOSIS — O365920 Maternal care for other known or suspected poor fetal growth, second trimester, not applicable or unspecified: Secondary | ICD-10-CM | POA: Diagnosis present

## 2024-05-21 DIAGNOSIS — O09299 Supervision of pregnancy with other poor reproductive or obstetric history, unspecified trimester: Secondary | ICD-10-CM

## 2024-05-21 DIAGNOSIS — O3433 Maternal care for cervical incompetence, third trimester: Secondary | ICD-10-CM | POA: Insufficient documentation

## 2024-05-21 DIAGNOSIS — O99320 Drug use complicating pregnancy, unspecified trimester: Secondary | ICD-10-CM | POA: Insufficient documentation

## 2024-05-21 DIAGNOSIS — Z3A37 37 weeks gestation of pregnancy: Secondary | ICD-10-CM | POA: Diagnosis not present

## 2024-05-21 DIAGNOSIS — O365930 Maternal care for other known or suspected poor fetal growth, third trimester, not applicable or unspecified: Secondary | ICD-10-CM

## 2024-05-21 DIAGNOSIS — F1290 Cannabis use, unspecified, uncomplicated: Secondary | ICD-10-CM | POA: Insufficient documentation

## 2024-05-21 NOTE — Progress Notes (Signed)
 Maternal-Fetal Medicine Consultation  Name: Amanda Vincent  MRN: [id number]  GA: [id number] [redacted]w[redacted]d   Patient is here for fetal growth assessment and antenatal testing.  -Fetal growth restriction.  On ultrasound performed 3 weeks ago, the estimated fetal weight was at the 6th percentile and the abdominal circumference measurement at the 28th percentile. - Patient had prophylactic cerclage in this pregnancy.  Ultrasound  The estimated fetal weight is at the 10th percentile and the abdominal circumference measurement at the 34th percentile.  Interval weight gain is 633 g.  Femur length measurement is at the 1st percentile, but good interval growth is seen.  Normal amniotic fluid.  Umbilical artery Doppler showed normal forward diastolic flow. BPP 8/8.  I reassured the couple of good interval growth and resolution of fetal growth restriction.  Based on today's findings, and early term delivery is not indicated.  Patient can be delivered at [redacted] weeks gestation. I reassured the patient that smaller female length measurement is constitutional. Patient prefers to have delivery at [redacted] weeks gestation.  Recommendations - NST at your office. -No follow-up appointments were made.     Consultation including face-to-face (more than 50%) counseling 20 minutes.

## 2024-06-03 ENCOUNTER — Encounter (HOSPITAL_COMMUNITY): Payer: Self-pay | Admitting: Obstetrics and Gynecology

## 2024-06-03 ENCOUNTER — Inpatient Hospital Stay (HOSPITAL_COMMUNITY)

## 2024-06-03 ENCOUNTER — Telehealth (HOSPITAL_COMMUNITY): Payer: Self-pay | Admitting: *Deleted

## 2024-06-03 ENCOUNTER — Other Ambulatory Visit: Payer: Self-pay | Admitting: Obstetrics and Gynecology

## 2024-06-03 ENCOUNTER — Encounter (HOSPITAL_COMMUNITY): Payer: Self-pay | Admitting: *Deleted

## 2024-06-03 ENCOUNTER — Inpatient Hospital Stay (HOSPITAL_COMMUNITY)
Admission: AD | Admit: 2024-06-03 | Discharge: 2024-06-03 | Disposition: A | Discharge status: Home / Self Care | Attending: Obstetrics and Gynecology | Admitting: Obstetrics and Gynecology

## 2024-06-03 DIAGNOSIS — O365930 Maternal care for other known or suspected poor fetal growth, third trimester, not applicable or unspecified: Secondary | ICD-10-CM | POA: Insufficient documentation

## 2024-06-03 DIAGNOSIS — O368330 Maternal care for abnormalities of the fetal heart rate or rhythm, third trimester, not applicable or unspecified: Secondary | ICD-10-CM | POA: Insufficient documentation

## 2024-06-03 DIAGNOSIS — Z3A39 39 weeks gestation of pregnancy: Secondary | ICD-10-CM

## 2024-06-03 DIAGNOSIS — O99820 Streptococcus B carrier state complicating pregnancy: Secondary | ICD-10-CM | POA: Diagnosis not present

## 2024-06-03 DIAGNOSIS — O288 Other abnormal findings on antenatal screening of mother: Secondary | ICD-10-CM | POA: Diagnosis not present

## 2024-06-03 DIAGNOSIS — O289 Unspecified abnormal findings on antenatal screening of mother: Secondary | ICD-10-CM

## 2024-06-03 DIAGNOSIS — O3433 Maternal care for cervical incompetence, third trimester: Secondary | ICD-10-CM | POA: Insufficient documentation

## 2024-06-03 DIAGNOSIS — O3432 Maternal care for cervical incompetence, second trimester: Secondary | ICD-10-CM | POA: Diagnosis not present

## 2024-06-03 MED ORDER — FENTANYL CITRATE (PF) 100 MCG/2ML IJ SOLN: 50.0000 ug | INTRAMUSCULAR | Status: DC | PRN

## 2024-06-03 MED ORDER — PENICILLIN G POT IN DEXTROSE 60000 UNIT/ML IV SOLN
3.0000 10*6.[IU] | INTRAVENOUS | Status: DC
  Filled 2024-06-03: qty 50

## 2024-06-03 MED ORDER — SODIUM CHLORIDE 0.9 % IV SOLN: 5.0000 10*6.[IU] | INTRAVENOUS | Status: DC

## 2024-06-03 MED ORDER — SODIUM CHLORIDE 0.9 % IV SOLN: 5.0000 10*6.[IU] | Freq: Once | INTRAVENOUS | Status: DC

## 2024-06-03 NOTE — H&P (Signed)
 Amanda Vincent is Amanda 23 y.o. female presenting for fetal monitoring due to decels in the office and SGA.  EFW 10 % . OB History     Gravida  4   Para  0   Term  0   Preterm  0   AB  3   Living  0      SAB  3   IAB  0   Ectopic  0   Multiple  0   Live Births  0          Past Medical History:  Diagnosis Date   Acne    Acne vulgaris 04/29/2017   Anxiety    Asthma    not used in pregnancy   Bacterial vaginosis    Vitamin D deficiency    Past Surgical History:  Procedure Laterality Date   CERVICAL CERCLAGE N/Amanda 12/28/2023   Procedure: CERCLAGE, CERVIX, VAGINAL APPROACH;  Surgeon: Armond Cape, MD;  Location: [facility] LD ORS;  Service: Obstetrics;  Laterality: N/Amanda;   NO PAST SURGERIES     WISDOM TOOTH EXTRACTION     Family History: family history includes Healthy in her father and mother; Hypertension in her maternal grandmother; Stroke in her maternal grandmother. Social History:  reports that she has never smoked. She has never used smokeless tobacco. She reports that she does not currently use alcohol. She reports that she does not currently use drugs after having used the following drugs: Marijuana.     Maternal Diabetes: No Genetic Screening: Normal Maternal Ultrasounds/Referrals: Other: short cervix Fetal Ultrasounds or other Referrals:  Other:  Maternal Substance Abuse:  No Significant Maternal Medications:  None Significant Maternal Lab Results:  Group B Strep positive Number of Prenatal Visits:greater than 3 verified prenatal visits Maternal Vaccinations:TDap Other Comments:  h/o incompetent cervix  Review of Systems History Physical Examination: General appearance - alert, well appearing, and in no distress Lymphatics - no palpable lymphadenopathy Chest - clear to auscultation, no wheezes, rales or rhonchi, symmetric air entry Heart - normal rate and regular rhythm Abdomen - soft, nontender, nondistended, no masses or  organomegaly gravid Pelvic - spec exam no lesions seen Extremities - peripheral pulses normal, no pedal edema, no clubbing or cyanosis, Homan's sign negative bilaterally  Dilation: 1 Effacement (%): 50 Station: [employer] Exam by:: Amanda Gorter MD Blood pressure 124/72, pulse 75, temperature 97.8 F (36.6 C), temperature source Oral, resp. rate 16, height 5' 3 (1.6 m), weight 67.6 kg, last menstrual period 09/14/2023, SpO2 100%, unknown if currently breastfeeding. Exam Physical Exam  Prenatal labs: ABO, Rh: --/--/O POS (05/09 1000) Antibody: NEG (05/09 1000) Rubella: Immune (05/07 0000) RPR: Nonreactive (05/07 0000)  HBsAg: Negative (05/07 0000)  HIV: Non-reactive (05/07 0000)  GBS: Positive/-- (09/16 0000)   Assessment/Plan: IUP at term SGA Pt offered IOL she declined and will come back tomorrow. I agreed if BPP 8/8 Cat 1 nst Toco irreg GBS pos.  Plan PCN   Will do foley balloon with pitocin  in am Pain management per pt   Amanda Vincent Amanda Vincent 06/03/2024, 3:14 PM

## 2024-06-03 NOTE — Discharge Instructions (Signed)

## 2024-06-03 NOTE — Telephone Encounter (Signed)
 Preadmission screen

## 2024-06-03 NOTE — MAU Note (Cosign Needed)
 Maternal Assessment Unit Provider Note  Subjective: [name] is a 23 y.o. G59P0030 pregnant female at [redacted]w[redacted]d who presents to MAU today with complaint of deceleration on NST in the office.   She was sent over for evaluation after a deceleration was noted on NST in the office.  Pt notes good fetal movement. No ctx, no LOF, no vag bleeding. No other concerns.  IOL scheduled for tomorrow.  Receives care at [facility]. Prenatal records reviewed.  Pertinent items noted in HPI and remainder of comprehensive ROS otherwise negative.   Objective: BP 124/72   Pulse 75   Temp 97.8 F (36.6 C) (Oral)   Resp 16   Ht 5' 3 (1.6 m)   Wt 67.6 kg   LMP 09/14/2023   SpO2 100%   BMI 26.39 kg/m  Physical Exam Vitals reviewed.  Constitutional:      General: She is not in acute distress.    Appearance: She is well-developed. She is not diaphoretic.  Eyes:     General: No scleral icterus. Pulmonary:     Effort: Pulmonary effort is normal. No respiratory distress.  Skin:    General: Skin is warm and dry.  Neurological:     Mental Status: She is alert.     Coordination: Coordination normal.     MDM: Moderate risk  MAU Course:  Time: 4:36 PM   FHT: baseline 130 bpm, moderate variability, + accelerations, no decelerations,   Contractions: rare, not feeling them   Time: 2:45 PM BPP 8/8, vertex position. Dr. Armond came and spoke with patient, recommending admission today instead of scheduled IOL tomorrow. Patient declined, expressing she would prefer to be discharged and go home and return tomorrow for IOL.  Strict return precautions/labor precautions were given  Questions were answered to the satisfaction of the patient and/or family prior to discharge.   Assessment Medical screening exam complete    ICD-10-CM   1. Non-stress test with decelerations  O28.8 Discharge patient    2. [redacted] weeks gestation of pregnancy  Z3A.39 Discharge patient        Plan Discharge from MAU in stable condition with strict return/labor precautions Follow up tomorrow for IOL as scheduled.  Allergies as of 06/03/2024       Reactions   Corn-containing Products    Dog Epithelium    Mold Extract [trichophyton]    Peanut-containing Drug Products    Sesame Seed (diagnostic)         Medication List     TAKE these medications    acyclovir 400 MG tablet Commonly known as: ZOVIRAX Take 400 mg by mouth 5 (five) times daily.   adapalene  0.1 % cream Commonly known as: DIFFERIN  APPLY TO AFFECTED AREA EVERY DAY AT BEDTIME   albuterol  108 (90 Base) MCG/ACT inhaler Commonly known as: VENTOLIN  HFA Inhale 1-2 puffs into the lungs every 4 (four) hours as needed for wheezing or shortness of breath.   Clindamycin -Benzoyl Per (Refr) gel APPLY 1 EACH TOPICALLY 2 (TWO) TIMES DAILY.   EPINEPHrine  0.3 mg/0.3 mL Soaj injection Commonly known as: EpiPen  2-Pak Inject 0.3 mg into the muscle as needed for anaphylaxis.   loratadine 10 MG tablet Commonly known as: CLARITIN Take 10 mg by mouth 2 (two) times daily.   prenatal multivitamin Tabs tablet Take 1 tablet by mouth daily at 12 noon.        Amanda Leeroy NOVAK, MD 06/03/2024 4:36 PM

## 2024-06-03 NOTE — MAU Note (Signed)
.  Amanda Vincent is a 23 y.o. at [redacted]w[redacted]d here in MAU reporting: She was sent from [facility] office to further monitor FHR due to late deceleration during NST of 45 minutes, per phone call from office with this RN. Pt denies LOF, VB, reports +FM. Denies CTX. States her cerclage was removed at 36wks and her cervix remained closed today during SVE. Pt also states she is to be IOL tomorrow.  LMP: na Onset of complaint: 10/14 Pain score: none There were no vitals filed for this visit.   FHT: 125  Lab orders placed from triage: na

## 2024-06-04 ENCOUNTER — Other Ambulatory Visit: Payer: Self-pay

## 2024-06-04 ENCOUNTER — Encounter (HOSPITAL_COMMUNITY): Payer: Self-pay | Admitting: Obstetrics and Gynecology

## 2024-06-04 ENCOUNTER — Inpatient Hospital Stay (HOSPITAL_COMMUNITY)
Admission: RE | Admit: 2024-06-04 | Discharge: 2024-06-07 | DRG: 787 | Disposition: A | Discharge status: Home / Self Care | Attending: Obstetrics and Gynecology | Admitting: Obstetrics and Gynecology

## 2024-06-04 ENCOUNTER — Inpatient Hospital Stay (HOSPITAL_COMMUNITY)

## 2024-06-04 DIAGNOSIS — O114 Pre-existing hypertension with pre-eclampsia, complicating childbirth: Secondary | ICD-10-CM | POA: Diagnosis present

## 2024-06-04 DIAGNOSIS — Z3490 Encounter for supervision of normal pregnancy, unspecified, unspecified trimester: Secondary | ICD-10-CM | POA: Diagnosis present

## 2024-06-04 DIAGNOSIS — O9902 Anemia complicating childbirth: Secondary | ICD-10-CM | POA: Diagnosis not present

## 2024-06-04 DIAGNOSIS — Z8249 Family history of ischemic heart disease and other diseases of the circulatory system: Secondary | ICD-10-CM

## 2024-06-04 DIAGNOSIS — O26873 Cervical shortening, third trimester: Secondary | ICD-10-CM | POA: Diagnosis present

## 2024-06-04 DIAGNOSIS — O1092 Unspecified pre-existing hypertension complicating childbirth: Secondary | ICD-10-CM | POA: Diagnosis present

## 2024-06-04 DIAGNOSIS — O99824 Streptococcus B carrier state complicating childbirth: Secondary | ICD-10-CM | POA: Diagnosis not present

## 2024-06-04 DIAGNOSIS — O365930 Maternal care for other known or suspected poor fetal growth, third trimester, not applicable or unspecified: Secondary | ICD-10-CM | POA: Diagnosis not present

## 2024-06-04 DIAGNOSIS — D509 Iron deficiency anemia, unspecified: Secondary | ICD-10-CM | POA: Diagnosis not present

## 2024-06-04 DIAGNOSIS — Z3A39 39 weeks gestation of pregnancy: Secondary | ICD-10-CM

## 2024-06-04 DIAGNOSIS — Z98891 History of uterine scar from previous surgery: Secondary | ICD-10-CM

## 2024-06-04 DIAGNOSIS — O365990 Maternal care for other known or suspected poor fetal growth, unspecified trimester, not applicable or unspecified: Principal | ICD-10-CM | POA: Diagnosis present

## 2024-06-04 LAB — CBC
HCT: 27.6 % — ABNORMAL LOW (ref 36.0–46.0)
Hemoglobin: 9.4 g/dL — ABNORMAL LOW (ref 12.0–15.0)
MCH: 24.4 pg — ABNORMAL LOW (ref 26.0–34.0)
MCHC: 34.1 g/dL (ref 30.0–36.0)
MCV: 71.5 fL — ABNORMAL LOW (ref 80.0–100.0)
Platelets: 227 K/uL (ref 150–400)
RBC: 3.86 MIL/uL — ABNORMAL LOW (ref 3.87–5.11)
RDW: 16.7 % — ABNORMAL HIGH (ref 11.5–15.5)
WBC: 8.4 K/uL (ref 4.0–10.5)
nRBC: 0 % (ref 0.0–0.2)

## 2024-06-04 LAB — RPR: RPR Ser Ql: NONREACTIVE

## 2024-06-04 MED ORDER — FLEET ENEMA RE ENEM: 1.0000 | ENEMA | RECTAL | Status: DC | PRN

## 2024-06-04 MED ORDER — ONDANSETRON HCL 4 MG/2ML IJ SOLN
4.0000 mg | Freq: Four times a day (QID) | INTRAMUSCULAR | Status: DC | PRN
  Administered 2024-06-04 – 2024-06-05 (×3): 4 mg via INTRAVENOUS
  Filled 2024-06-04 (×3): qty 2

## 2024-06-04 MED ORDER — OXYTOCIN BOLUS FROM INFUSION: 333.0000 mL | Freq: Once | INTRAVENOUS | Status: DC

## 2024-06-04 MED ORDER — OXYTOCIN-SODIUM CHLORIDE 30-0.9 UT/500ML-% IV SOLN: 2.5000 [IU]/h | INTRAVENOUS | Status: DC

## 2024-06-04 MED ORDER — OXYTOCIN-SODIUM CHLORIDE 30-0.9 UT/500ML-% IV SOLN
1.0000 m[IU]/min | INTRAVENOUS | Status: DC
  Administered 2024-06-04: 1 m[IU]/min via INTRAVENOUS
  Administered 2024-06-05: 9 m[IU]/min via INTRAVENOUS
  Filled 2024-06-04 (×2): qty 500

## 2024-06-04 MED ORDER — LACTATED RINGERS IV SOLN: INTRAVENOUS | Status: DC

## 2024-06-04 MED ORDER — TERBUTALINE SULFATE 1 MG/ML IJ SOLN: 0.2500 mg | Freq: Once | INTRAMUSCULAR | Status: DC | PRN

## 2024-06-04 MED ORDER — LACTATED RINGERS IV SOLN: 500.0000 mL | INTRAVENOUS | Status: AC | PRN

## 2024-06-04 MED ORDER — LIDOCAINE HCL (PF) 1 % IJ SOLN: 30.0000 mL | INTRAMUSCULAR | Status: DC | PRN

## 2024-06-04 MED ORDER — SOD CITRATE-CITRIC ACID 500-334 MG/5ML PO SOLN
30.0000 mL | ORAL | Status: DC | PRN
  Administered 2024-06-05: 30 mL via ORAL
  Filled 2024-06-04: qty 30

## 2024-06-04 MED ORDER — PENICILLIN G POT IN DEXTROSE 60000 UNIT/ML IV SOLN
3.0000 10*6.[IU] | INTRAVENOUS | Status: DC
  Administered 2024-06-04 – 2024-06-05 (×5): 3 10*6.[IU] via INTRAVENOUS
  Filled 2024-06-04 (×5): qty 50

## 2024-06-04 MED ORDER — SODIUM CHLORIDE 0.9 % IV SOLN
5.0000 10*6.[IU] | Freq: Once | INTRAVENOUS | Status: AC
  Administered 2024-06-04: 5 10*6.[IU] via INTRAVENOUS
  Filled 2024-06-04: qty 5

## 2024-06-04 MED ORDER — FENTANYL CITRATE (PF) 100 MCG/2ML IJ SOLN
100.0000 ug | INTRAMUSCULAR | Status: DC | PRN
Start: 2024-06-04 — End: 2024-06-05
  Administered 2024-06-04 – 2024-06-05 (×3): 100 ug via INTRAVENOUS
  Filled 2024-06-04 (×3): qty 2

## 2024-06-04 NOTE — Progress Notes (Signed)
 Pt is feeling her contractions BP 117/79   Pulse 77   Temp 97.7 F (36.5 C) (Oral)   Resp 18   Ht 5' 4 (1.626 m)   Wt 66.8 kg   LMP 09/14/2023   BMI 25.27 kg/m   Cat 1 Toco q2-4 min Continue pitocin  AROM once head is engaged  Anticipate SVD Continue PCN

## 2024-06-04 NOTE — Progress Notes (Signed)
   Labor Progress Note  Amanda Vincent, 23 y.o., G4P0030, with an IUP @ [redacted]w[redacted]d, presented for IOL for fetal decel noted on NST at [facility] office, SGA 10% EFW.   Subjective: Assumed care of pt in NAD, was on the birthing ball when I entered the room, pt feeling cxt, but very mild and can tolerate well, foley bulb was out a 2, no cervical change noted since foley out, discussed POC, fetal station to high for AROM, reviewed R/B/A, pt optted for pit break and then restarting pitocin  1X1 and getting som rest tonight.  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 No intake/output data recorded. NST: FHR baseline 120 bpm, Variability: moderate, Accelerations:present, Decelerations:  Absent= Cat 1/Reactive CTX:  irregular, every 1.5-3 minutes Uterus gravid, soft non tender, moderate to palpate with contractions.  SVE:  Dilation: 4 Effacement (%): 50 Station: Ballotable Exam by:: Dallana Mavity  Pitocin  at 9 and turned off for dinner break mUn/min  Assessment:  Amanda Vincent, 23 y.o., G4P0030, with an IUP @ [redacted]w[redacted]d, presented for IOL for fetal decel noted on NST at [facility] office, SGA 10% EFW.  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   History of incompetent cervix, currently pregnant 12/27/2023   History of abnormal cervical Pap smear 12/27/2023   Asthma    NICHD: Category 1  Membranes:  Intact, no s/s of infection  Induction:    Cytotec  x None  Foley Bulb: inserted  11am 10/15 and out at 1400 10/15  Pitocin  - 9 currently and turned off for pitocin  break   Pain management:               IV pain management: x PRN  Nitrous: PRN             Epidural placement: Desires when in pain   GBS positive  Abx: PCN 10/15 @ 0913, 1320   Plan: Continue labor plan Continuous monitoring Rest Ambulate Frequent position changes to facilitate fetal rotation and descent. Will reassess with cervical exam at 4 hours or earlier if necessary After eating light dinner pitocin  per protocol 1x1 Anticipate labor progression and vaginal delivery.  Malikye Reppond  CNM, FNP-C, PMHNP-BC  [address]  [place], [place] [address]  Cell: [phone]  Office Phone: [phone] Fax: [phone] 06/04/2024  6:04 PM

## 2024-06-04 NOTE — Progress Notes (Signed)
   Labor Progress Note  Amanda Vincent, 23 y.o., G4P0030, with an IUP @ [redacted]w[redacted]d, presented for IOL for fetal decel noted on NST at [facility] office, SGA 10% EFW.   Subjective: Pt was able to eat, attempting to rest now, plans for sleep, feeling mild cxt. On pitocin  of 6.  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 No intake/output data recorded. NST: FHR baseline 125 bpm, Variability: moderate, Accelerations:present, Decelerations:  Absent= Cat 1/Reactive CTX:  irregular, every 1.5-4 minutes Uterus gravid, soft non tender, moderate to palpate with contractions.  SVE:  Dilation: 4 Effacement (%): 50 Station: Ballotable Exam by:: Tyson Pitocin  at 6 mUn/min  Assessment:  Amanda Vincent, 23 y.o., G4P0030, with an IUP @ [redacted]w[redacted]d, presented for IOL for fetal decel noted on NST at [facility] office, SGA 10% EFW. 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   History of incompetent cervix, currently pregnant 12/27/2023   History of abnormal cervical Pap  smear 12/27/2023   Asthma    NICHD: Category 1  Membranes:  Intact, no s/s of infection  Induction:    Cytotec  x None  Foley Bulb: inserted  11am 10/15 and out at 1400 10/15  Pitocin  - 6  Pain management:               IV pain management: x IV fentanyl  10/15 @ 1122, 1227  Nitrous: PRN             Epidural placement: Desires when in pain   GBS positive  Abx: PCN 10/15 @ 0913, 1320, 1906   Plan: Continue labor plan Continuous monitoring Rest Ambulate Frequent position changes to facilitate fetal rotation and descent. Will reassess with cervical exam at 4 hours or earlier if necessary Continue pitocin  per protocol 1x1 Anticipate labor progression and vaginal delivery.  Carlosdaniel Grob  CNM, FNP-C, PMHNP-BC  [address]  [place], [place] [address]  Cell: [phone]  Office Phone: [phone] Fax: [phone] 06/04/2024  10:41 PM

## 2024-06-05 ENCOUNTER — Encounter (HOSPITAL_COMMUNITY): Payer: Self-pay | Admitting: Obstetrics and Gynecology

## 2024-06-05 ENCOUNTER — Encounter (HOSPITAL_COMMUNITY)
Admission: RE | Disposition: A | Discharge status: Home / Self Care | Payer: Self-pay | Source: Home / Self Care | Attending: Obstetrics and Gynecology

## 2024-06-05 ENCOUNTER — Inpatient Hospital Stay (HOSPITAL_COMMUNITY): Admitting: Anesthesiology

## 2024-06-05 DIAGNOSIS — Z3A39 39 weeks gestation of pregnancy: Secondary | ICD-10-CM | POA: Diagnosis not present

## 2024-06-05 DIAGNOSIS — Z3490 Encounter for supervision of normal pregnancy, unspecified, unspecified trimester: Secondary | ICD-10-CM | POA: Diagnosis present

## 2024-06-05 DIAGNOSIS — O43893 Other placental disorders, third trimester: Secondary | ICD-10-CM | POA: Diagnosis not present

## 2024-06-05 DIAGNOSIS — Z98891 History of uterine scar from previous surgery: Secondary | ICD-10-CM

## 2024-06-05 DIAGNOSIS — D509 Iron deficiency anemia, unspecified: Secondary | ICD-10-CM | POA: Diagnosis present

## 2024-06-05 LAB — CBC
HCT: 30.9 % — ABNORMAL LOW (ref 36.0–46.0)
Hemoglobin: 10.8 g/dL — ABNORMAL LOW (ref 12.0–15.0)
MCH: 26.8 pg (ref 26.0–34.0)
MCHC: 35 g/dL (ref 30.0–36.0)
MCV: 76.7 fL — ABNORMAL LOW (ref 80.0–100.0)
Platelets: 150 K/uL (ref 150–400)
RBC: 4.03 MIL/uL (ref 3.87–5.11)
RDW: 19.3 % — ABNORMAL HIGH (ref 11.5–15.5)
WBC: 17.4 K/uL — ABNORMAL HIGH (ref 4.0–10.5)
nRBC: 0 % (ref 0.0–0.2)

## 2024-06-05 LAB — PREPARE RBC (CROSSMATCH)

## 2024-06-05 SURGERY — Surgical Case
Anesthesia: Epidural

## 2024-06-05 MED ORDER — OXYTOCIN-SODIUM CHLORIDE 30-0.9 UT/500ML-% IV SOLN
INTRAVENOUS | Status: DC | PRN
  Administered 2024-06-05: 300 mL via INTRAVENOUS

## 2024-06-05 MED ORDER — ONDANSETRON HCL 4 MG/2ML IJ SOLN: 4.0000 mg | Freq: Once | INTRAMUSCULAR | Status: DC | PRN

## 2024-06-05 MED ORDER — WITCH HAZEL-GLYCERIN EX PADS: 1.0000 | MEDICATED_PAD | CUTANEOUS | Status: DC | PRN

## 2024-06-05 MED ORDER — MORPHINE SULFATE (PF) 0.5 MG/ML IJ SOLN
INTRAMUSCULAR | Status: AC
  Filled 2024-06-05: qty 10

## 2024-06-05 MED ORDER — OXYCODONE HCL 5 MG PO TABS: 5.0000 mg | ORAL_TABLET | Freq: Once | ORAL | Status: DC | PRN

## 2024-06-05 MED ORDER — MORPHINE SULFATE (PF) 0.5 MG/ML IJ SOLN
INTRAMUSCULAR | Status: DC | PRN
  Administered 2024-06-05: 3 mg via EPIDURAL

## 2024-06-05 MED ORDER — FENTANYL-BUPIVACAINE-NACL 0.5-0.125-0.9 MG/250ML-% EP SOLN
12.0000 mL/h | EPIDURAL | Status: DC | PRN
  Administered 2024-06-05: 12 mL/h via EPIDURAL
  Filled 2024-06-05: qty 250

## 2024-06-05 MED ORDER — PRENATAL MULTIVITAMIN CH
1.0000 | ORAL_TABLET | Freq: Every day | ORAL | Status: DC
  Administered 2024-06-06 – 2024-06-07 (×2): 1 via ORAL
  Filled 2024-06-05 (×2): qty 1

## 2024-06-05 MED ORDER — CEFAZOLIN SODIUM-DEXTROSE 2-4 GM/100ML-% IV SOLN
INTRAVENOUS | Status: AC
  Filled 2024-06-05: qty 100

## 2024-06-05 MED ORDER — PHENYLEPHRINE 80 MCG/ML (10ML) SYRINGE FOR IV PUSH (FOR BLOOD PRESSURE SUPPORT)
80.0000 ug | PREFILLED_SYRINGE | INTRAVENOUS | Status: AC | PRN
  Administered 2024-06-05 (×2): 80 ug via INTRAVENOUS

## 2024-06-05 MED ORDER — KETOROLAC TROMETHAMINE 30 MG/ML IJ SOLN
INTRAMUSCULAR | Status: AC
  Filled 2024-06-05: qty 1

## 2024-06-05 MED ORDER — OXYCODONE HCL 5 MG PO TABS
5.0000 mg | ORAL_TABLET | ORAL | Status: DC | PRN
  Administered 2024-06-06: 5 mg via ORAL
  Administered 2024-06-07 (×2): 10 mg via ORAL
  Filled 2024-06-05 (×2): qty 2
  Filled 2024-06-05: qty 1

## 2024-06-05 MED ORDER — SIMETHICONE 80 MG PO CHEW
80.0000 mg | CHEWABLE_TABLET | Freq: Three times a day (TID) | ORAL | Status: DC
  Administered 2024-06-06 – 2024-06-07 (×5): 80 mg via ORAL
  Filled 2024-06-05 (×5): qty 1

## 2024-06-05 MED ORDER — FENTANYL CITRATE (PF) 100 MCG/2ML IJ SOLN
INTRAMUSCULAR | Status: DC | PRN
  Administered 2024-06-05: 100 ug via EPIDURAL

## 2024-06-05 MED ORDER — SOD CITRATE-CITRIC ACID 500-334 MG/5ML PO SOLN
30.0000 mL | ORAL | Status: AC
  Administered 2024-06-05: 30 mL via ORAL
  Filled 2024-06-05: qty 30

## 2024-06-05 MED ORDER — MENTHOL 3 MG MT LOZG: 1.0000 | LOZENGE | OROMUCOSAL | Status: DC | PRN

## 2024-06-05 MED ORDER — DIPHENHYDRAMINE HCL 50 MG/ML IJ SOLN
12.5000 mg | INTRAMUSCULAR | Status: DC | PRN
  Administered 2024-06-05: 12.5 mg via INTRAVENOUS
  Filled 2024-06-05: qty 1

## 2024-06-05 MED ORDER — KETOROLAC TROMETHAMINE 30 MG/ML IJ SOLN
30.0000 mg | Freq: Four times a day (QID) | INTRAMUSCULAR | Status: AC
  Administered 2024-06-05 – 2024-06-06 (×4): 30 mg via INTRAVENOUS
  Filled 2024-06-05 (×3): qty 1

## 2024-06-05 MED ORDER — FENTANYL CITRATE (PF) 100 MCG/2ML IJ SOLN: 50.0000 ug | INTRAMUSCULAR | Status: DC | PRN

## 2024-06-05 MED ORDER — SODIUM CHLORIDE 0.9 % IV SOLN
500.0000 mg | INTRAVENOUS | Status: AC
  Administered 2024-06-05 (×2): 500 mg via INTRAVENOUS

## 2024-06-05 MED ORDER — TRANEXAMIC ACID-NACL 1000-0.7 MG/100ML-% IV SOLN
INTRAVENOUS | Status: DC | PRN
  Administered 2024-06-05: 1000 mg via INTRAVENOUS

## 2024-06-05 MED ORDER — DEXAMETHASONE SOD PHOSPHATE PF 10 MG/ML IJ SOLN
INTRAMUSCULAR | Status: DC | PRN
  Administered 2024-06-05: 10 mg via INTRAVENOUS

## 2024-06-05 MED ORDER — ALBUMIN HUMAN 5 % IV SOLN: INTRAVENOUS | Status: DC | PRN

## 2024-06-05 MED ORDER — ONDANSETRON HCL 4 MG/2ML IJ SOLN
INTRAMUSCULAR | Status: DC | PRN
  Administered 2024-06-05: 4 mg via INTRAVENOUS

## 2024-06-05 MED ORDER — OXYTOCIN-SODIUM CHLORIDE 30-0.9 UT/500ML-% IV SOLN: 1.0000 m[IU]/min | INTRAVENOUS | Status: DC

## 2024-06-05 MED ORDER — LACTATED RINGERS IV SOLN: INTRAVENOUS | Status: DC | PRN

## 2024-06-05 MED ORDER — OXYTOCIN-SODIUM CHLORIDE 30-0.9 UT/500ML-% IV SOLN
2.5000 [IU]/h | INTRAVENOUS | Status: AC
  Filled 2024-06-05: qty 500

## 2024-06-05 MED ORDER — MEPERIDINE HCL 25 MG/ML IJ SOLN: 6.2500 mg | INTRAMUSCULAR | Status: DC | PRN

## 2024-06-05 MED ORDER — IBUPROFEN 600 MG PO TABS
600.0000 mg | ORAL_TABLET | Freq: Four times a day (QID) | ORAL | Status: DC
  Administered 2024-06-06 – 2024-06-07 (×5): 600 mg via ORAL
  Filled 2024-06-05 (×5): qty 1

## 2024-06-05 MED ORDER — SODIUM CHLORIDE 0.9 % IV SOLN
INTRAVENOUS | Status: DC | PRN
Start: 2024-06-05 — End: 2024-06-05

## 2024-06-05 MED ORDER — OXYTOCIN-SODIUM CHLORIDE 30-0.9 UT/500ML-% IV SOLN
1.0000 m[IU]/min | INTRAVENOUS | Status: DC
  Administered 2024-06-05: 1 m[IU]/min via INTRAVENOUS

## 2024-06-05 MED ORDER — LIDOCAINE-EPINEPHRINE (PF) 2 %-1:200000 IJ SOLN
INTRAMUSCULAR | Status: AC
  Filled 2024-06-05: qty 20

## 2024-06-05 MED ORDER — DIPHENHYDRAMINE HCL 25 MG PO CAPS
25.0000 mg | ORAL_CAPSULE | Freq: Four times a day (QID) | ORAL | Status: DC | PRN
  Administered 2024-06-06 (×3): 25 mg via ORAL
  Filled 2024-06-05 (×3): qty 1

## 2024-06-05 MED ORDER — DEXMEDETOMIDINE HCL IN NACL 80 MCG/20ML IV SOLN
INTRAVENOUS | Status: DC | PRN
  Administered 2024-06-05 (×2): 8 ug via INTRAVENOUS

## 2024-06-05 MED ORDER — FERROUS SULFATE 325 (65 FE) MG PO TABS
325.0000 mg | ORAL_TABLET | ORAL | Status: DC
  Administered 2024-06-06: 325 mg via ORAL
  Filled 2024-06-05: qty 1

## 2024-06-05 MED ORDER — OXYTOCIN-SODIUM CHLORIDE 30-0.9 UT/500ML-% IV SOLN
1.0000 m[IU]/min | INTRAVENOUS | Status: DC
  Administered 2024-06-05: 2 m[IU]/min via INTRAVENOUS

## 2024-06-05 MED ORDER — ONDANSETRON HCL 4 MG/2ML IJ SOLN
INTRAMUSCULAR | Status: AC
  Filled 2024-06-05: qty 4

## 2024-06-05 MED ORDER — SODIUM CHLORIDE 0.9 % IV SOLN
3.0000 g | Freq: Four times a day (QID) | INTRAVENOUS | Status: AC
  Administered 2024-06-05 – 2024-06-06 (×4): 3 g via INTRAVENOUS
  Filled 2024-06-05 (×4): qty 8

## 2024-06-05 MED ORDER — STERILE WATER FOR IRRIGATION IR SOLN
Status: DC | PRN
  Administered 2024-06-05 (×2): 1

## 2024-06-05 MED ORDER — PHENYLEPHRINE 80 MCG/ML (10ML) SYRINGE FOR IV PUSH (FOR BLOOD PRESSURE SUPPORT)
80.0000 ug | PREFILLED_SYRINGE | INTRAVENOUS | Status: DC | PRN
  Administered 2024-06-05: 240 ug via INTRAVENOUS
  Administered 2024-06-05: 80 ug via INTRAVENOUS
  Filled 2024-06-05: qty 10

## 2024-06-05 MED ORDER — FENTANYL CITRATE (PF) 100 MCG/2ML IJ SOLN
INTRAMUSCULAR | Status: AC
  Filled 2024-06-05: qty 2

## 2024-06-05 MED ORDER — ACETAMINOPHEN 500 MG PO TABS
1000.0000 mg | ORAL_TABLET | Freq: Four times a day (QID) | ORAL | Status: DC
  Administered 2024-06-05 – 2024-06-07 (×8): 1000 mg via ORAL
  Filled 2024-06-05 (×8): qty 2

## 2024-06-05 MED ORDER — COCONUT OIL OIL: 1.0000 | TOPICAL_OIL | Status: DC | PRN

## 2024-06-05 MED ORDER — LACTATED RINGERS IV SOLN: 500.0000 mL | Freq: Once | INTRAVENOUS | Status: DC

## 2024-06-05 MED ORDER — SIMETHICONE 80 MG PO CHEW
80.0000 mg | CHEWABLE_TABLET | ORAL | Status: DC | PRN
Start: 2024-06-05 — End: 2024-06-08

## 2024-06-05 MED ORDER — DIBUCAINE (PERIANAL) 1 % EX OINT: 1.0000 | TOPICAL_OINTMENT | CUTANEOUS | Status: DC | PRN

## 2024-06-05 MED ORDER — LACTATED RINGERS IV SOLN
500.0000 mL | INTRAVENOUS | Status: DC | PRN
  Administered 2024-06-05: 500 mL via INTRAVENOUS

## 2024-06-05 MED ORDER — FENTANYL CITRATE (PF) 100 MCG/2ML IJ SOLN: INTRAMUSCULAR | Status: DC | PRN

## 2024-06-05 MED ORDER — OXYCODONE HCL 5 MG/5ML PO SOLN: 5.0000 mg | Freq: Once | ORAL | Status: DC | PRN

## 2024-06-05 MED ORDER — SODIUM CHLORIDE 0.9 % IV SOLN
INTRAVENOUS | Status: AC
  Filled 2024-06-05: qty 5

## 2024-06-05 MED ORDER — SENNOSIDES-DOCUSATE SODIUM 8.6-50 MG PO TABS
2.0000 | ORAL_TABLET | Freq: Every day | ORAL | Status: DC
  Administered 2024-06-06 – 2024-06-07 (×2): 2 via ORAL
  Filled 2024-06-05 (×2): qty 2

## 2024-06-05 MED ORDER — FENTANYL CITRATE (PF) 100 MCG/2ML IJ SOLN: 25.0000 ug | INTRAMUSCULAR | Status: DC | PRN

## 2024-06-05 MED ORDER — LACTATED RINGERS AMNIOINFUSION: INTRAVENOUS | Status: DC

## 2024-06-05 MED ORDER — ACETAMINOPHEN 10 MG/ML IV SOLN
INTRAVENOUS | Status: DC | PRN
  Administered 2024-06-05: 1000 mg via INTRAVENOUS

## 2024-06-05 MED ORDER — ZOLPIDEM TARTRATE 5 MG PO TABS: 5.0000 mg | ORAL_TABLET | Freq: Every evening | ORAL | Status: DC | PRN

## 2024-06-05 MED ORDER — PHENYLEPHRINE HCL-NACL 20-0.9 MG/250ML-% IV SOLN
INTRAVENOUS | Status: DC | PRN
  Administered 2024-06-05: 50 ug/min via INTRAVENOUS

## 2024-06-05 MED ORDER — EPHEDRINE 5 MG/ML INJ: 10.0000 mg | INTRAVENOUS | Status: DC | PRN

## 2024-06-05 MED ORDER — LIDOCAINE HCL (PF) 1 % IJ SOLN
INTRAMUSCULAR | Status: DC | PRN
Start: 2024-06-05 — End: 2024-06-05
  Administered 2024-06-05: 8 mL via EPIDURAL

## 2024-06-05 MED ORDER — EPHEDRINE 5 MG/ML INJ
10.0000 mg | INTRAVENOUS | Status: DC | PRN
Start: 2024-06-05 — End: 2024-06-05

## 2024-06-05 MED ORDER — CEFAZOLIN SODIUM-DEXTROSE 2-4 GM/100ML-% IV SOLN
2.0000 g | INTRAVENOUS | Status: AC
  Administered 2024-06-05 (×2): 2 g via INTRAVENOUS

## 2024-06-05 MED ORDER — LIDOCAINE-EPINEPHRINE (PF) 2 %-1:200000 IJ SOLN
INTRAMUSCULAR | Status: DC | PRN
  Administered 2024-06-05 (×3): 5 mL via EPIDURAL

## 2024-06-05 SURGICAL SUPPLY — 35 items
BENZOIN TINCTURE PRP APPL 2/3 (GAUZE/BANDAGES/DRESSINGS) ×1 IMPLANT
CHLORAPREP W/TINT 26 (MISCELLANEOUS) ×2 IMPLANT
CLAMP UMBILICAL CORD (MISCELLANEOUS) ×1 IMPLANT
CLOTH BEACON ORANGE TIMEOUT ST (SAFETY) ×1 IMPLANT
DRSG OPSITE POSTOP 4X10 (GAUZE/BANDAGES/DRESSINGS) ×1 IMPLANT
ELECTRODE REM PT RTRN 9FT ADLT (ELECTROSURGICAL) ×1 IMPLANT
EXTRACTOR VACUUM M CUP 4 TUBE (SUCTIONS) IMPLANT
GAUZE PAD ABD 7.5X8 STRL (GAUZE/BANDAGES/DRESSINGS) IMPLANT
GAUZE SPONGE 4X4 12PLY STRL LF (GAUZE/BANDAGES/DRESSINGS) IMPLANT
GLOVE BIO SURGEON STRL SZ7.5 (GLOVE) ×1 IMPLANT
GLOVE BIOGEL PI IND STRL 7.0 (GLOVE) ×1 IMPLANT
GLOVE BIOGEL PI IND STRL 7.5 (GLOVE) ×1 IMPLANT
GOWN STRL REUS W/TWL LRG LVL3 (GOWN DISPOSABLE) ×2 IMPLANT
HEMOSTAT ARISTA ABSORB 3G PWDR (HEMOSTASIS) IMPLANT
KIT ABG SYR 3ML LUER SLIP (SYRINGE) IMPLANT
MAT PREVALON FULL STRYKER (MISCELLANEOUS) IMPLANT
NDL HYPO 25X5/8 SAFETYGLIDE (NEEDLE) IMPLANT
NEEDLE HYPO 25X5/8 SAFETYGLIDE (NEEDLE) IMPLANT
NS IRRIG 1000ML POUR BTL (IV SOLUTION) ×1 IMPLANT
PACK C SECTION WH (CUSTOM PROCEDURE TRAY) ×1 IMPLANT
PAD ABD DERMACEA PRESS 5X9 (GAUZE/BANDAGES/DRESSINGS) IMPLANT
PAD OB MATERNITY 4.3X12.25 (PERSONAL CARE ITEMS) ×1 IMPLANT
RTRCTR C-SECT PINK 25CM LRG (MISCELLANEOUS) ×1 IMPLANT
STRIP CLOSURE SKIN 1/2X4 (GAUZE/BANDAGES/DRESSINGS) ×1 IMPLANT
SUT CHROMIC 2 0 CT 1 (SUTURE) ×1 IMPLANT
SUT MNCRL 0 VIOLET CTX 36 (SUTURE) ×1 IMPLANT
SUT MNCRL AB 3-0 PS2 27 (SUTURE) ×1 IMPLANT
SUT VIC AB 0 CT1 36 (SUTURE) ×1 IMPLANT
SUT VIC AB 0 CTX36XBRD ANBCTRL (SUTURE) ×3 IMPLANT
SUT VIC AB 2-0 SH 27XBRD (SUTURE) IMPLANT
SUTURE PLAIN GUT 2.0 ETHICON (SUTURE) ×1 IMPLANT
TAPE CLOTH SURG 4X10 WHT LF (GAUZE/BANDAGES/DRESSINGS) IMPLANT
TOWEL OR 17X24 6PK STRL BLUE (TOWEL DISPOSABLE) ×1 IMPLANT
TRAY FOLEY W/BAG SLVR 14FR LF (SET/KITS/TRAYS/PACK) ×1 IMPLANT
WATER STERILE IRR 1000ML POUR (IV SOLUTION) ×1 IMPLANT

## 2024-06-05 NOTE — Progress Notes (Addendum)
 Labor Progress Note  Amanda Vincent, 23 y.o., G4P0030, with an IUP @ [redacted]w[redacted]d, presented for IOL for fetal decel noted on NST at [facility] office, SGA 10% EFW.   Subjective: Pt unable to rest worried about needing to have a CS, lates were noted on fetal strip with cat 2, pitocin  was on 2 and dropped to one, cxt not adequate, MVU remains  120, pt plans to rest now knowing she has some control, and  feels she will be able to rest. We discussed R/B/A of PCS versus decel with pitocin , pt desires for now to continue with pitocin  at 1 and monitor fetal status. Pt is aware at this time we can not go up on pitocin . We will wait for a cat 1 strip then can go to 2 of pit if cxt not adequate. Later at or around 10am lates were noted , amnio and maternal position change were not helping, pitocin  was tuned off from 1 to 0, DR Henry called and notified and was instructed to turn amnioinfusion off, benadryl  was given By RN.  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 Total I/O In: -  Out: 450 [Urine:450] NST: FHR baseline 125 bpm, Variability: moderate, Accelerations:present, Decelerations:  lates and varibles noted post epidural and SROM= Cat 2/Reactive CTX: 2-5 Uterus gravid, soft non tender, moderate to palpate with contractions.  SVE:  Dilation: 6 Effacement (%): 90 Station:  0 Exam by:: Merlin Golden \ Pitocin  at 1 turned off mUn/min  Assessment:  Amanda Vincent, 23 y.o., G4P0030, with an IUP @ [redacted]w[redacted]d, presented for IOL for fetal decel noted on NST at [facility] office, SGA 10% EFW. In active protracted labor with pitocin . Benadryl  given per RN for cervical swelling.  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   Membranes:  SROM, clear, 10/16 @ 0433, no s/s of infection  IUPC Placed with ease: MVU: 120  Induction:    Cytotec  x None  Foley Bulb: inserted  11am 10/15 and out at 1400 10/15  Pitocin  - 1 turn off  Pain management:               IV pain management: x IV fentanyl  10/15 @ 1122, 1227, 10/16 @ 0100  Nitrous: PRN  Epidural placement: Placed at 0335 on 10/16  GBS positive  Abx: PCN 10/15 @ 0913, 1320, 1906, 10/16 @ 0428, 0929   Plan: Continue labor plan Continuous monitoring Rest Frequent position changes to facilitate fetal rotation and descent. Will reassess with cervical exam at 4 hours or earlier if necessary Stop amnio infusion  Stop pitocin   IV Benadryl  for swelling given  Anticipate labor progression and vaginal delivery, but mode of delivery unknown.  Amanda Vincent  CNM, FNP-C, PMHNP-BC  [address]  [place], [place] [address]  Cell: [phone]  Office Phone: [phone] Fax: [phone] 06/05/2024  10:58 AM

## 2024-06-05 NOTE — Progress Notes (Signed)
   Labor Progress Note  Amanda Vincent, 23 y.o., G4P0030, with an IUP @ [redacted]w[redacted]d, presented for IOL for fetal decel noted on NST at [facility] office, SGA 10% EFW.   Subjective: Pt finished one hour pitocin  break the resumption of cat 1 strip, restart pitocin  2x1 for induction, 1 cxt noted in 10 mins, mvu 20. Pt comfortable, suspected OP fetal positioning, pt on hands and knees with shaking apple tree for fetal repositioning, tolerated well then back on high fowlers.  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 No intake/output data recorded. NST: FHR baseline 125 bpm, Variability: moderate, Accelerations:present, Decelerations:  lates and varibles noted post epidural and SROM= Cat 2/Reactive CTX: 1 in 10 Uterus gravid, soft non tender, moderate to palpate with contractions.  SVE:  Dilation: 6 Effacement (%): 90 Station: 0 Exam by:: Infantof Villagomez  Pitocin  at 2 mUn/min  Assessment:  Amanda Vincent, 23 y.o., G4P0030, with an IUP @ [redacted]w[redacted]d, presented for IOL for fetal decel noted on NST at [facility] office, SGA 10% EFW. 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   History of incompetent cervix, currently pregnant 12/27/2023   History of abnormal cervical Pap smear 12/27/2023   Asthma    NICHD: Category 2: fluid bolus and maternal position change done, pheny and amnio infusing   Membranes:  SROM, clear, 10/16 @ 0433, no s/s of infection  IUPC Placed with ease: MVU: 20  Induction:    Cytotec  x None  Foley Bulb: inserted  11am 10/15 and out at 1400 10/15  Pitocin  - 2  Pain management:               IV pain management: x IV fentanyl  10/15 @ 1122, 1227, 10/16 @ 0100  Nitrous: PRN             Epidural placement: Placed at 0335 on 10/16  GBS positive  Abx: PCN 10/15 @ 0913, 1320, 1906, 10/16 @ 0428   Plan: Continue labor plan Continuous monitoring Rest Frequent position changes to facilitate fetal rotation and descent. Will reassess with cervical exam at 4 hours or earlier if necessary Continue amnio 150ml/hr maintenance.  Restart pitocin  per protocol 2x1 Anticipate labor progression and vaginal delivery.  Sentoria Brent  CNM, FNP-C, PMHNP-BC  [address]  [place], [place] [address]  Cell: [phone]  Office Phone: [phone] Fax: [phone] 06/05/2024  8:20 AM

## 2024-06-05 NOTE — Progress Notes (Signed)
 Labor Progress Note  Amanda Vincent, 23 y.o., G4P0030, with an IUP @ [redacted]w[redacted]d, presented for IOL for fetal decel noted on NST at [facility] office, SGA 10% EFW.   Subjective: Called to the Room by RN for lates and varibles decels noted, pt got epidural 1 hours prior and just SROM, clear fluids, lowere BP, phen given right after epidural, pt now comfortable, pitocin  still on 9, when RN called pitocin  was ordered to stop, bolus to be given and position change and henl again, when I entered the room decels were resolving, BP normotensive and phenldid not have to be given again. She is progressing in active labro now and disscussed R/B/A of IUPC r/t unable to fully decern cxt pattern, pt consented and it was placed with ease, fetus tsarting to recover, then fetus started having more deeper variables noted to nadir of 70, pitocin  was cut in half to 4, she became hypotensive and phenl was given at that time, amnio was started, partner sleeping at bedside.  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 No intake/output data recorded. NST: FHR baseline 125 bpm, Variability: moderate, Accelerations:present, Decelerations:  lates and varibles noted post epidural and SROM= Cat 2/Reactive CTX:  regular, every 3 minutes Uterus gravid, soft non tender, moderate to palpate with contractions.   SVE:  Dilation: 6 Effacement (%): 90 Station: 0 Exam by:: Jontay Maston, CNM Pitocin  at 4 mUn/min  Assessment:  Amanda Vincent, 23 y.o., G4P0030, with an IUP @ [redacted]w[redacted]d, presented for IOL for fetal decel noted on NST at [facility] office, SGA 10% EFW. 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   History of incompetent cervix, currently pregnant 12/27/2023   History of abnormal cervical Pap smear 12/27/2023   Asthma    NICHD: Category 2: fluid bolus and maternal position change done, pheny and amnio started  Membranes:  SROM, clear, 10/16 @ 0433, no s/s of infection  IUPC Placed with ease: MVU: 180  Induction:    Cytotec  x None  Foley Bulb: inserted  11am 10/15 and out at 1400 10/15  Pitocin  - 4  Pain management:               IV pain management: x IV fentanyl  10/15 @ 1122, 1227, 10/16 @ 0100  Nitrous: PRN             Epidural placement: Placed at 0335 on 10/16  GBS positive  Abx: PCN 10/15 @ 0913, 1320, 1906, 10/16 @ 0428   Plan:  Continue labor plan Continuous monitoring Rest Ambulate Frequent position changes to facilitate fetal rotation and descent. Will reassess with cervical exam at 4 hours or earlier if necessary Start amnio bolus with 150ml/hr maintenance.  Decrease pitocin  to 4 Continue pitocin  per protocol 1x1 Anticipate labor progression and vaginal delivery.  Verlyn Dannenberg  CNM, FNP-C, PMHNP-BC  [address]  [place], [place] [address]  Cell: [phone]  Office Phone: [phone] Fax: [phone] 06/05/2024  5:17 AM

## 2024-06-05 NOTE — Transfer of Care (Signed)
 Immediate Anesthesia Transfer of Care Note  Patient: Amanda Vincent  Procedure(s) Performed: CESAREAN DELIVERY  Patient Location: PACU  Anesthesia Type:Epidural  Level of Consciousness: awake, alert , and oriented  Airway & Oxygen Therapy: Patient Spontanous Breathing  Post-op Assessment: Report given to RN and Post -op Vital signs reviewed and stable  Post vital signs: Reviewed and stable  Last Vitals:  Vitals Value Taken Time  BP 121/68   Temp    Pulse 111 06/05/24 16:55  Resp 16 06/05/24 16:55  SpO2 100 % 06/05/24 16:55  Vitals shown include unfiled device data.  Last Pain:  Vitals:   06/05/24 1400  TempSrc: Axillary  PainSc:       Patients Stated Pain Goal: 0 (06/05/24 9161)  Complications: No notable events documented.

## 2024-06-05 NOTE — Progress Notes (Addendum)
   Labor Progress Note  Amanda Vincent, 23 y.o., G4P0030, with an IUP @ [redacted]w[redacted]d, presented for IOL for fetal decel noted on NST at [facility] office, SGA 10% EFW.   Subjective: In to the room to assess, pt was able to sleep a little, cat 1 strip was present, DR Henry instructed to start pitocin  1x1, pt agreeable.  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 Total I/O In: -  Out: 450 [Urine:450] NST: FHR baseline 125 bpm, Variability: moderate, Accelerations:present, Decelerations:  no= Cat 1/Reactive CTX: 1 in 10 Uterus gravid, soft non tender, moderate to palpate with contractions.  SVE:  Dilation: 6 Effacement (%): 90 Station: 0 Exam by:: Rashidi Loh \ Pitocin  at 1 mUn/min  Assessment:  Amanda Vincent, 23 y.o., G4P0030, with an IUP @ [redacted]w[redacted]d, presented for IOL for fetal decel noted on NST at [facility] office, SGA 10% EFW. In active protracted labor with pitocin . Benadryl  given per RN for cervical swelling.  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   History of  incompetent cervix, currently pregnant 12/27/2023   History of abnormal cervical Pap smear 12/27/2023   Asthma    NICHD: Category 1  Membranes:  SROM, clear, 10/16 @ 0433, no s/s of infection  IUPC Placed with ease: MVU: 40-80  Induction:    Cytotec  x None  Foley Bulb: inserted  11am 10/15 and out at 1400 10/15  Pitocin  - 1  Pain management:               IV pain management: x IV fentanyl  10/15 @ 1122, 1227, 10/16 @ 0100  Nitrous: PRN             Epidural placement: Placed at 0335 on 10/16  GBS positive  Abx: PCN 10/15 @ 0913, 1320, 1906, 10/16 @ 0428, 0929   Plan: Continue labor plan Continuous monitoring Rest Frequent position changes to facilitate fetal rotation and descent. Will reassess with cervical exam at 4 hours or earlier if necessary Restart pitocin  1x1 Anticipate mode of delivery for a PCS for NRFHT.   Amberlie Gaillard  CNM, FNP-C, PMHNP-BC  [address]  [place], [place] [address]  Cell: [phone]  Office Phone: [phone] Fax: [phone] 06/05/2024  12:32 PM

## 2024-06-05 NOTE — Anesthesia Preprocedure Evaluation (Signed)
 Anesthesia Evaluation  Patient identified by MRN, date of birth, ID band Patient awake    Reviewed: Allergy & Precautions, Patient's Chart, lab work & pertinent test results  Airway Mallampati: II  TM Distance: >3 FB Neck ROM: Full    Dental no notable dental hx.    Pulmonary asthma    Pulmonary exam normal breath sounds clear to auscultation       Cardiovascular negative cardio ROS Normal cardiovascular exam Rhythm:Regular Rate:Normal     Neuro/Psych  PSYCHIATRIC DISORDERS Anxiety     negative neurological ROS     GI/Hepatic negative GI ROS, Neg liver ROS,,,  Endo/Other  negative endocrine ROS    Renal/GU negative Renal ROS  negative genitourinary   Musculoskeletal negative musculoskeletal ROS (+)    Abdominal   Peds negative pediatric ROS (+)  Hematology  (+) Blood dyscrasia, anemia Hb 9.4, plt 227   Anesthesia Other Findings   Reproductive/Obstetrics (+) Pregnancy                              Anesthesia Physical Anesthesia Plan  ASA: 2  Anesthesia Plan: Epidural   Post-op Pain Management:    Induction:   PONV Risk Score and Plan: 2  Airway Management Planned: Natural Airway  Additional Equipment: None  Intra-op Plan:   Post-operative Plan:   Informed Consent: I have reviewed the patients History and Physical, chart, labs and discussed the procedure including the risks, benefits and alternatives for the proposed anesthesia with the patient or authorized representative who has indicated his/her understanding and acceptance.       Plan Discussed with:   Anesthesia Plan Comments:         Anesthesia Quick Evaluation

## 2024-06-05 NOTE — Progress Notes (Signed)
   Labor Progress Note  Amanda Vincent, 23 y.o., G4P0030, with an IUP @ [redacted]w[redacted]d, presented for IOL for fetal decel noted on NST at [facility] office, SGA 10% EFW.   Subjective: Reoccurring late started pitocin  turned off again, DR Henry in Surgery, will notified her once she is done, and pt will rest and allow fetus to return to Cat 1. 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 Total I/O In: -  Out: 450 [Urine:450] NST: FHR baseline 135 bpm, Variability: moderate, Accelerations:present, Decelerations:  reoccurring lates= Cat \\2 /Reactive CTX: 2 in 10 Uterus gravid, soft non tender, moderate to palpate with contractions.  SVE:  Dilation: 6 Effacement (%): 90 Station: 0 Exam by:: Shad Ledvina \ Pitocin  at 2 and tuned off for the thrid time  mUn/min  Assessment:  Amanda Vincent, 23 y.o., G4P0030, with an IUP @ [redacted]w[redacted]d, presented for IOL for fetal decel noted on NST at [facility] office, SGA 10% EFW. In active protracted labor with pitocin . Benadryl  given per RN for cervical swelling.  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   Membranes:  SROM, clear, 10/16 @ 0433, no s/s of infection  IUPC Placed with ease: MVU: 80  Induction:    Cytotec  x None  Foley Bulb: inserted  11am 10/15 and out at 1400 10/15  Pitocin  - 2 and turned off for the third time  Pain management:               IV pain management: x IV fentanyl  10/15 @ 1122, 1227, 10/16 @ 0100  Nitrous: PRN             Epidural placement: Placed at 0335 on 10/16  GBS positive  Abx: PCN 10/15 @ 0913, 1320, 1906, 10/16 @ 0428, 0929   Plan: Continue labor plan Continuous monitoring Rest Frequent position changes to facilitate fetal rotation and descent. Stop pitocin   Anticipate mode of delivery for a PCS for NRFHT.   Will report to DR Henry when she is out of surgery   Mance Vallejo  CNM, FNP-C, PMHNP-BC  [id number]  [place], [place] [address]  Cell: [phone]  Office Phone: [phone] Fax: [phone] 06/05/2024  1:21 PM

## 2024-06-05 NOTE — Progress Notes (Signed)
 Patient with fetal intolerance to labor each time pitocin  is restarted although tracing is cat 1 and reactive.  Jade discussed multiple times today possibility of c-section given the tracing.  Patient initially was very opposed to the idea but she is ready to proceed with c-section.  I discussed the risks benefits and alternatives discussed including but not limited to bleeding infection and injury.  Pt did not have any additional questions.  Consent signed and witnessed.

## 2024-06-05 NOTE — Op Note (Signed)
 Cesarean Section Procedure Note  Indications: 1.39 5/7wks 2.IOL 3.SGA 4.Fetal Intolerance of Labor  Pre-operative Diagnosis: 1.39 5/7wks 2.IOL 3.SGA 4.Fetal Intolerance of Labor   Post-operative Diagnosis: 1.39 5/7wks 2.IOL 3.SGA 4.Fetal Intolerance of Labor 5.LOT  Procedure: Primary Low Transverse Cesarean Section  Surgeon: Jon Rummer, MD  Assistants: Vermell Montana , CNM  Anesthesia: Regional  Anesthesiologist: Mallory Manus, MD   Procedure Details  The patient was taken to the operating room secondary to fetal intolerance of labor after the risks, benefits, complications, treatment options, and expected outcomes were discussed with the patient.  The patient concurred with the proposed plan, giving informed consent which was signed and witnessed. The patient was taken to Operating Room C, identified as Amanda Vincent and the procedure verified as C-Section Delivery. A Time Out was held and the above information confirmed.  After induction of anesthesia by obtaining a spinal, the patient was prepped and draped in the usual sterile manner. A Pfannenstiel skin incision was made and carried down through the subcutaneous tissue to the underlying layer of fascia.  The fascia was incised bilaterally and extended transversely bilaterally with the Mayo scissors. Kocher clamps were placed on the inferior aspect of the fascial incision and the underlying rectus muscle was separated from the fascia. The same was done on the superior aspect of the fascial incision.  The peritoneum was identified, entered bluntly and extended manually.  An Alexis self-retaining retractor was placed.  The utero-vesical peritoneal reflection was incised transversely and the bladder flap was bluntly freed from the lower uterine segment. A low transverse uterine incision was made with the scalpel and extended bilaterally with the bandage scissors.  The infant was delivered in vertex position without difficulty.  After the  umbilical cord was clamped and cut, the infant was handed to the awaiting pediatricians.  Cord blood was obtained for evaluation.  The placenta was removed intact and appeared to be within normal limits. As the uterus was being cleared of clots and debris the patient started hemorrhaging from the posterior wall of the uterus lin two distinct locations.  T clamps were placed and TXA, 800mcg rectal cytotec  and pitocin  was already infusing.  The T clamp was removed to assess and the bleeding persisted.  Figure of eight stitches were placed at both locations and the bleeding stabilized (Jada had been requested but was not needed by this time).  The uterine incision was then closed with running interlocking sutures of 0 Vicryl and a second imbricating layer was performed as well.   Bilateral tubes and ovaries appeared to be within normal limits.  Good hemostasis was noted.  Copious irrigation was performed until clear.  The peritoneum was repaired with 2-0 chromic via a running suture.  The fascia was reapproximated with a running suture of 0 Vicryl.  The skin was reapproximated with a subcuticular suture of 3-0 monocryl.  Steristrips were applied with bezoin and honeycomb dressing applied.  Instrument, sponge, and needle counts were correct prior to abdominal closure and at the conclusion of the case.  The patient was awaiting transfer to the recovery room in good condition.  Findings: Live female infant with Apgars 9 at one minute and 9 at five minutes.  Normal appearing bilateral ovaries and fallopian tubes were noted.  Estimated Blood Loss:  2415 ml         Drains: foley to gravity 50cc concentrated appearing urine         Total IV Fluids: 1200 ml 250 cc Albumin 2u PRBCs  Specimens to Pathology: Placenta         Complications:  None; patient tolerated the procedure well.         Disposition: PACU - hemodynamically stable.         Condition: stable  Attending Attestation: I performed the  procedure.  I was present and scrubbed and the assistant was required due to complexity of anatomy.

## 2024-06-05 NOTE — H&P (Signed)
 Amanda Vincent is a 23 y.o. female presenting for fetal monitoring due to decels in the office and SGA.  EFW 10 % . OB History     Gravida  4   Para  0   Term  0   Preterm  0   AB  3   Living  0      SAB  3   IAB  0   Ectopic  0   Multiple  0   Live Births  0          Past Medical History:  Diagnosis Date   Acne    Acne vulgaris 04/29/2017   Anxiety    Asthma    not used in pregnancy   Bacterial vaginosis    Vitamin D deficiency    Past Surgical History:  Procedure Laterality Date   CERVICAL CERCLAGE N/A 12/28/2023   Procedure: CERCLAGE, CERVIX, VAGINAL APPROACH;  Surgeon: Armond Cape, MD;  Location: [facility] LD ORS;  Service: Obstetrics;  Laterality: N/A;   NO PAST SURGERIES     WISDOM TOOTH EXTRACTION     Family History: family history includes Healthy in her father and mother; Hypertension in her maternal grandmother; Stroke in her maternal grandmother. Social History:  reports that she has never smoked. She has never used smokeless tobacco. She reports that she does not currently use alcohol. She reports that she does not currently use drugs after having used the following drugs: Marijuana.     Maternal Diabetes: No Genetic Screening: Normal Maternal Ultrasounds/Referrals: Other: short cervix Fetal Ultrasounds or other Referrals:  Other:  Maternal Substance Abuse:  No Significant Maternal Medications:  None Significant Maternal Lab Results:  Group B Strep positive Number of Prenatal Visits:greater than 3 verified prenatal visits Maternal Vaccinations:TDap Other Comments:  h/o incompetent cervix  Review of Systems  Constitutional: Negative.   HENT: Negative.    Eyes: Negative.   Respiratory: Negative.    Cardiovascular: Negative.   Gastrointestinal: Negative.   Endocrine: Negative.   Genitourinary: Negative.   Musculoskeletal: Negative.   Skin: Negative.   Allergic/Immunologic: Negative.   Neurological: Negative.   Hematological:  Negative.   Psychiatric/Behavioral: Negative.     History Physical Examination: General appearance - alert, well appearing, and in no distress Lymphatics - no palpable lymphadenopathy Chest - clear to auscultation, no wheezes, rales or rhonchi, symmetric air entry Heart - normal rate and regular rhythm Abdomen - soft, nontender, nondistended, no masses or organomegaly gravid Pelvic - spec exam no lesions seen Extremities - peripheral pulses normal, no pedal edema, no clubbing or cyanosis, Homan's sign negative bilaterally  Dilation: 6 Effacement (%): 90 Station: 0 Exam by:: Palin Tristan \ Blood pressure 118/68, pulse 73, temperature 98.9 F (37.2 C), temperature source Axillary, resp. rate 18, height 5' 4 (1.626 m), weight 66.8 kg, last menstrual period 09/14/2023, SpO2 100%, unknown if currently breastfeeding. Exam Physical Exam Constitutional:      Appearance: Normal appearance.  HENT:     Head: Normocephalic and atraumatic.     Nose: Nose normal.     Mouth/Throat:     Mouth: Mucous membranes are moist.  Eyes:     Conjunctiva/sclera: Conjunctivae normal.  Cardiovascular:     Rate and Rhythm: Normal rate and regular rhythm.     Pulses: Normal pulses.     Heart sounds: Normal heart sounds.  Pulmonary:     Effort: Pulmonary effort is normal.     Breath sounds: Normal breath sounds.  Abdominal:  General: Bowel sounds are normal.  Musculoskeletal:        General: Normal range of motion.     Cervical back: Normal range of motion and neck supple.  Skin:    General: Skin is warm.     Capillary Refill: Capillary refill takes less than 2 seconds.  Neurological:     General: No focal deficit present.     Mental Status: She is alert.  Psychiatric:        Mood and Affect: Mood normal.     Prenatal labs: ABO, Rh: --/--/O POS (10/15 0740) Antibody: NEG (10/15 0740) Rubella: Immune (05/07 0000) RPR: NON REACTIVE (10/15 0758)  HBsAg: Negative (05/07 0000)  HIV:  Non-reactive (05/07 0000)  GBS: Positive/-- (09/16 0000)   Assessment/Plan: IUP at term SGA Pt offered IOL she declined and will come back tomorrow. I agreed if BPP 8/8 Cat 1 nst Toco irreg GBS pos.  Plan PCN   Will do foley balloon with pitocin  in am Pain management per pt   Ison Wichmann  06/05/2024, 3:14 PM   HP copied from admission on 06/03/2024, no changes except going for a PCS  for NSFT with DR Henry

## 2024-06-05 NOTE — Anesthesia Procedure Notes (Signed)
 Epidural Patient location during procedure: OB Start time: 06/05/2024 2:38 AM End time: 06/05/2024 2:48 AM  Staffing Anesthesiologist: Merla Almarie HERO, DO Performed: anesthesiologist   Preanesthetic Checklist Completed: patient identified, IV checked, risks and benefits discussed, monitors and equipment checked, pre-op evaluation and timeout performed  Epidural Patient position: sitting Prep: DuraPrep and site prepped and draped Patient monitoring: continuous pulse ox, blood pressure, heart rate and cardiac monitor Approach: midline Location: L3-L4 Injection technique: LOR air  Needle:  Needle type: Tuohy  Needle gauge: 17 G Needle length: 9 cm Needle insertion depth: 6 cm Catheter type: closed end flexible Catheter size: 19 Gauge Catheter at skin depth: 11 cm Test dose: negative  Assessment Sensory level: T8 Events: blood not aspirated, no cerebrospinal fluid, injection not painful, no injection resistance, no paresthesia and negative IV test  Additional Notes Patient identified. Risks/Benefits/Options discussed with patient including but not limited to bleeding, infection, nerve damage, paralysis, failed block, incomplete pain control, headache, blood pressure changes, nausea, vomiting, reactions to medication both or allergic, itching and postpartum back pain. Confirmed with bedside nurse the patient's most recent platelet count. Confirmed with patient that they are not currently taking any anticoagulation, have any bleeding history or any family history of bleeding disorders. Patient expressed understanding and wished to proceed. All questions were answered. Sterile technique was used throughout the entire procedure. Please see nursing notes for vital signs. Test dose was given through epidural catheter and negative prior to continuing to dose epidural or start infusion. Warning signs of high block given to the patient including shortness of breath, tingling/numbness in  hands, complete motor block, or any concerning symptoms with instructions to call for help. Patient was given instructions on fall risk and not to get out of bed. All questions and concerns addressed with instructions to call with any issues or inadequate analgesia.  Reason for block:procedure for pain

## 2024-06-05 NOTE — H&P (Deleted)
 Amanda Vincent is a 23 y.o. female presenting for fetal monitoring due to decels in the office and SGA.  EFW 10 % . OB History     Gravida  4   Para  0   Term  0   Preterm  0   AB  3   Living  0      SAB  3   IAB  0   Ectopic  0   Multiple  0   Live Births  0          Past Medical History:  Diagnosis Date   Acne    Acne vulgaris 04/29/2017   Anxiety    Asthma    not used in pregnancy   Bacterial vaginosis    Vitamin D deficiency    Past Surgical History:  Procedure Laterality Date   CERVICAL CERCLAGE N/A 12/28/2023   Procedure: CERCLAGE, CERVIX, VAGINAL APPROACH;  Surgeon: Armond Cape, MD;  Location: [facility] LD ORS;  Service: Obstetrics;  Laterality: N/A;   NO PAST SURGERIES     WISDOM TOOTH EXTRACTION     Family History: family history includes Healthy in her father and mother; Hypertension in her maternal grandmother; Stroke in her maternal grandmother. Social History:  reports that she has never smoked. She has never used smokeless tobacco. She reports that she does not currently use alcohol. She reports that she does not currently use drugs after having used the following drugs: Marijuana.     Maternal Diabetes: No Genetic Screening: Normal Maternal Ultrasounds/Referrals: Other: short cervix Fetal Ultrasounds or other Referrals:  Other:  Maternal Substance Abuse:  No Significant Maternal Medications:  None Significant Maternal Lab Results:  Group B Strep positive Number of Prenatal Visits:greater than 3 verified prenatal visits Maternal Vaccinations:TDap Other Comments:  h/o incompetent cervix  Review of Systems  Constitutional: Negative.   HENT: Negative.    Eyes: Negative.   Respiratory: Negative.    Cardiovascular: Negative.   Gastrointestinal: Negative.   Endocrine: Negative.   Genitourinary: Negative.   Musculoskeletal: Negative.   Skin: Negative.   Allergic/Immunologic: Negative.   Neurological: Negative.   Hematological:  Negative.   Psychiatric/Behavioral: Negative.     History Physical Examination: General appearance - alert, well appearing, and in no distress Lymphatics - no palpable lymphadenopathy Chest - clear to auscultation, no wheezes, rales or rhonchi, symmetric air entry Heart - normal rate and regular rhythm Abdomen - soft, nontender, nondistended, no masses or organomegaly gravid Pelvic - spec exam no lesions seen Extremities - peripheral pulses normal, no pedal edema, no clubbing or cyanosis, Homan's sign negative bilaterally  Dilation: 6 Effacement (%): 90 Station: 0 Exam by:: Hernandez Losasso \ Blood pressure 118/68, pulse 73, temperature 98.9 F (37.2 C), temperature source Axillary, resp. rate 18, height 5' 4 (1.626 m), weight 66.8 kg, last menstrual period 09/14/2023, SpO2 100%, unknown if currently breastfeeding. Exam Physical Exam Constitutional:      Appearance: Normal appearance.  HENT:     Head: Normocephalic and atraumatic.     Nose: Nose normal.     Mouth/Throat:     Mouth: Mucous membranes are moist.  Eyes:     Conjunctiva/sclera: Conjunctivae normal.  Cardiovascular:     Rate and Rhythm: Normal rate and regular rhythm.     Pulses: Normal pulses.     Heart sounds: Normal heart sounds.  Pulmonary:     Effort: Pulmonary effort is normal.     Breath sounds: Normal breath sounds.  Abdominal:  General: Bowel sounds are normal.  Musculoskeletal:        General: Normal range of motion.     Cervical back: Normal range of motion and neck supple.  Skin:    General: Skin is warm.     Capillary Refill: Capillary refill takes less than 2 seconds.  Neurological:     General: No focal deficit present.     Mental Status: She is alert.  Psychiatric:        Mood and Affect: Mood normal.     Prenatal labs: ABO, Rh: --/--/O POS (10/15 0740) Antibody: NEG (10/15 0740) Rubella: Immune (05/07 0000) RPR: NON REACTIVE (10/15 0758)  HBsAg: Negative (05/07 0000)  HIV:  Non-reactive (05/07 0000)  GBS: Positive/-- (09/16 0000)   Assessment/Plan: IUP at term SGA Pt offered IOL she declined and will come back tomorrow. I agreed if BPP 8/8 Cat 1 nst Toco irreg GBS pos.  Plan PCN   Will do foley balloon with pitocin  in am Pain management per pt   Amanda Vincent  06/05/2024, 3:09 PM   HP copied from admission on 06/03/2024, no changes except going for a PCS  for NSFT with DR Henry

## 2024-06-06 LAB — POCT I-STAT EG7
Acid-base deficit: 2 mmol/L (ref 0.0–2.0)
Bicarbonate: 23.1 mmol/L (ref 20.0–28.0)
Calcium, Ion: 1.12 mmol/L — ABNORMAL LOW (ref 1.15–1.40)
HCT: 26 % — ABNORMAL LOW (ref 36.0–46.0)
Hemoglobin: 8.8 g/dL — ABNORMAL LOW (ref 12.0–15.0)
O2 Saturation: 42 %
Potassium: 4.1 mmol/L (ref 3.5–5.1)
Sodium: 135 mmol/L (ref 135–145)
TCO2: 24 mmol/L (ref 22–32)
pCO2, Ven: 39.5 mmHg — ABNORMAL LOW (ref 44–60)
pH, Ven: 7.376 (ref 7.25–7.43)
pO2, Ven: 24 mmHg — CL (ref 32–45)

## 2024-06-06 LAB — COMPREHENSIVE METABOLIC PANEL WITH GFR
ALT: 12 U/L (ref 0–44)
AST: 33 U/L (ref 15–41)
Albumin: 2.4 g/dL — ABNORMAL LOW (ref 3.5–5.0)
Alkaline Phosphatase: 139 U/L — ABNORMAL HIGH (ref 38–126)
Anion gap: 9 (ref 5–15)
BUN: <5 mg/dL — ABNORMAL LOW (ref 6–20)
CO2: 22 mmol/L (ref 22–32)
Calcium: 8.3 mg/dL — ABNORMAL LOW (ref 8.9–10.3)
Chloride: 101 mmol/L (ref 98–111)
Creatinine, Ser: 0.81 mg/dL (ref 0.44–1.00)
GFR, Estimated: >60 mL/min (ref >60)
Glucose, Bld: 80 mg/dL (ref 70–99)
Potassium: 4.4 mmol/L (ref 3.5–5.1)
Sodium: 132 mmol/L — ABNORMAL LOW (ref 135–145)
Total Bilirubin: 0.8 mg/dL (ref 0.0–1.2)
Total Protein: 5.9 g/dL — ABNORMAL LOW (ref 6.5–8.1)

## 2024-06-06 LAB — CBC
HCT: 29.9 % — ABNORMAL LOW (ref 36.0–46.0)
HCT: 30.2 % — ABNORMAL LOW (ref 36.0–46.0)
Hemoglobin: 10.6 g/dL — ABNORMAL LOW (ref 12.0–15.0)
Hemoglobin: 10.7 g/dL — ABNORMAL LOW (ref 12.0–15.0)
MCH: 26.6 pg (ref 26.0–34.0)
MCH: 26.8 pg (ref 26.0–34.0)
MCHC: 35.1 g/dL (ref 30.0–36.0)
MCHC: 35.8 g/dL (ref 30.0–36.0)
MCV: 74.9 fL — ABNORMAL LOW (ref 80.0–100.0)
MCV: 75.7 fL — ABNORMAL LOW (ref 80.0–100.0)
Platelets: 145 K/uL — ABNORMAL LOW (ref 150–400)
Platelets: 153 K/uL (ref 150–400)
RBC: 3.99 MIL/uL (ref 3.87–5.11)
RBC: 3.99 MIL/uL (ref 3.87–5.11)
RDW: 18.4 % — ABNORMAL HIGH (ref 11.5–15.5)
RDW: 18.6 % — ABNORMAL HIGH (ref 11.5–15.5)
WBC: 19.6 K/uL — ABNORMAL HIGH (ref 4.0–10.5)
WBC: 20.7 K/uL — ABNORMAL HIGH (ref 4.0–10.5)
nRBC: 0 % (ref 0.0–0.2)
nRBC: 0 % (ref 0.0–0.2)

## 2024-06-06 NOTE — Anesthesia Postprocedure Evaluation (Signed)
 Anesthesia Post Note  Patient: Amanda Vincent  Procedure(s) Performed: CESAREAN DELIVERY     Patient location during evaluation: Mother Baby Anesthesia Type: Epidural Level of consciousness: awake and alert Pain management: pain level controlled Vital Signs Assessment: post-procedure vital signs reviewed and stable Respiratory status: spontaneous breathing, nonlabored ventilation and respiratory function stable Cardiovascular status: stable Postop Assessment: no headache, no backache and epidural receding Anesthetic complications: no   No notable events documented.  Last Vitals:  Vitals:   06/05/24 2210 06/06/24 0225  BP: 119/76 120/82  Pulse: 70 67  Resp:    Temp: 36.8 C 36.9 C  SpO2: 100% 99%    Last Pain:  Vitals:   06/05/24 2310  TempSrc:   PainSc: 3    Pain Goal: Patients Stated Pain Goal: 0 (06/05/24 9161)                 Kingslee Dowse

## 2024-06-06 NOTE — Lactation Note (Signed)
 This note was copied from a baby's chart. Lactation Consultation Note  Patient Name: Amanda Vincent Unijb'd Date: 06/06/2024 Age:24 hours  Reason for consult: 1st time breastfeeding;Follow-up assessment;Term (C/S delivery and infant with weight loss -4.63%) P1, term female infant. MOB states  infant is latching well at the breast, no concerns for LC at this time.  LC did not observe latch due infant recently breastfeeding for 10 minutes at 2100 pm and currently FOB is doing skin to skin with infant. MOB is aware that infant may start cluster feeding tonight now that he is 38 days old and that this is normal infant behavior. LC reviewed hand expression using breast model and MOB taught back  hand expression and  colostrum is present. MOB will continue to breastfeed infant by cues, on demand, 8-12 times within 24 hours. LC discussed the importance of maternal rest, meals and hydration. MOB knows to call for latch assistance if needed.   Maternal Data    Feeding Mother's Current Feeding Choice: Breast Milk  LATCH Score                    Lactation Tools Discussed/Used    Interventions Interventions: Skin to skin;Hand express;Expressed milk;Education  Discharge    Consult Status Consult Status: Follow-up Date: 06/07/24 Follow-up type: In-patient    Grayce LULLA Batter 06/06/2024, 9:47 PM

## 2024-06-06 NOTE — Clinical Social Work Maternal (Signed)
 CLINICAL SOCIAL WORK MATERNAL/CHILD NOTE   Patient Details  Name: Amanda Vincent MRN: [id number] Date of Birth: 08/17/2001   Date:  December 16, 2023   Clinical Social Worker Initiating Note:  Amanda Vincent      Date/Time: Initiated:  06/06/24/1455      Child's Name:  Boy 05/25/2024    Biological Parents:  Mother, Father Amanda Vincent 3/31/2001Donnice Vincent 09/22/1995)    Need for Interpreter:  None    Reason for Referral:  Current Substance Use/Substance Use During Pregnancy      Address:  [address] [place] [address]    Phone number:  [phone] (home)      Additional phone number:    Household Members/Support Persons (HM/SP):   Household Member/Support Person 1     HM/SP Name Relationship DOB or Age  HM/SP -1 Amanda Vincent FOB 09/22/1995  HM/SP -2     HM/SP -3     HM/SP -4     HM/SP -5     HM/SP -6     HM/SP -7     HM/SP -8         Natural Supports (not living in the home):  Parent    Professional Supports: None    Employment: Unemployed    Type of Work:      Education:  Engineer, agricultural    Homebound arranged:     Surveyor, quantity Resources:  Medicaid    Other Resources:       Cultural/Religious Considerations Which May Impact Care:     Strengths:  Ability to meet basic needs  , Home prepared for child  , Pediatrician chosen    Psychotropic Medications:          Pediatrician:    Armed forces operational officer area   Pediatrician List:    [facility]  [place]   Amanda Vincent   [place] [facility]       Pediatrician Fax Number:     Risk Factors/Current Problems:  None    Cognitive State:  Able to Concentrate  , Alert      Mood/Affect:  Calm  , Comfortable      CSW Assessment: CSW receive a consult for hx of Anxiety and THC us  during pregnancy. CSW met with MOB to complete assessment and offer support. CSW entered the room and observed MOB resting in bed the infant  the bassinet and FOB and her sister at bedside. CSW introduced self, CSW role and reason for visit, MOB was agreeable to visit and allowed FOB and her sister to remain in the room. CSW inquired about how MOB was feeling, MOB repotted good. CSW confirmed MOB demographic information, MOB verified the information on file was correct. CSW inquired about MOB MH hx MOB reported she had one anxiety attack one time  a while ago but has never been diagnosed with anxiety and reported no concerns with her MH currently. MOB reported a stable mood throughout pregnancy. CSW provided education regarding the baby blues period vs. perinatal mood disorders, discussed treatment and Vincent resources for mental health follow up if concerns arise.  CSW recommends self-evaluation during the postpartum time period using the New Mom Checklist from Postpartum Progress and encouraged MOB to contact a medical professional if symptoms are noted at any time. MOB identified FOB, her sister, mother, and grandmother a her primary supports.    CSW inquired about MOB noted THC use, MOB reported she used  THC to help with nausea and to have an appetite. MOB reported her last use was in August. CSW explained the hospital drug screen policy, MOB verbalized understanding. CSW informed MOB infants UDS was positive THC an the CDS was pending MOB verbalized understanding. MOB denied any other substance use.    CSW provided review of Sudden Infant Death Syndrome (SIDS) precautions.  MOB reported she has all essential items for the infant including a Vincent n play and car seat.  CSW identifies no further need for intervention and no barriers to discharge at this time..    CSW Plan/Description:  No Further Intervention Required/No Barriers to Discharge, Sudden Infant Death Syndrome (SIDS) Education, Perinatal Mood and Anxiety Disorder (PMADs) Education, CSW Will Continue to Monitor Umbilical Cord Tissue Drug Screen Results and Make Report if Warranted, Child  Protective Service Report  , Hospital Drug Screen Policy Information      Amanda Vincent, [place] 26-Apr-2024, 3:00 PM

## 2024-06-06 NOTE — Progress Notes (Signed)
 Subjective: Postpartum Day 1: Cesarean Delivery Patient reports tolerating PO, + flatus, and no problems voiding.    Objective: Vital signs in last 24 hours: Temp:  [97.9 F (36.6 C)-100 F (37.8 C)] 98.2 F (36.8 C) (10/17 1507) Pulse Rate:  [67-118] 92 (10/17 1507) Resp:  [13-25] 16 (10/17 1507) BP: (107-132)/(68-88) 132/88 (10/17 1507) SpO2:  [98 %-100 %] 100 % (10/17 1507)  Physical Exam:  General: alert, cooperative, and no distress Lochia: appropriate Uterine Fundus: FF, NT Incision: dressing c/d/i DVT Evaluation: No evidence of DVT seen on physical exam. No cords or calf tenderness.  Recent Labs    06/06/24 0024 06/06/24 0536  HGB 10.6* 10.7*  HCT 30.2* 29.9*    Assessment/Plan: Status post Cesarean section. Doing well postoperatively.  Continue current care. Cont routine post op care Plan circumcision tomorrow Asymptomatic anemia Jon CINDERELLA Rummer, MD 06/06/2024, 4:36 PM

## 2024-06-06 NOTE — Lactation Note (Addendum)
 This note was copied from a baby's chart. Lactation Consultation Note  Patient Name: Amanda Vincent Date: 06/06/2024 Age:24 hours Reason for consult: Initial assessment;Primapara;Term  P1.mom stated the baby has latched well a few times. He is sleepy and takes a little bit to get him to wake up and feed. Mom denies painful latches. Newborn feeding habits reviewed. Encouraged STS. Mom in bed w/o a top on . Mom stated she had been doing STS some just recently put some clothes on him. Mom doesn't have a pump. LC asked mom if she would like to apply for on e mom stated that would be real nice she'd like that. Discussed body alignment, support, I&O, when BF. Worked w/mom on latching and holding breast. Mom done well. Baby just wasn't that interested at this time. Praised mom for doing good. Mom encouraged to feed baby 8-12 times/24 hours and with feeding cues.  Encouraged mom to call for assistance or questions. Maternal Data Does the patient have breastfeeding experience prior to this delivery?: No  Feeding Mother's Current Feeding Choice: Breast Milk and Formula  LATCH Score       Type of Nipple: Everted at rest and after stimulation  Comfort (Breast/Nipple): Soft / non-tender         Lactation Tools Discussed/Used    Interventions Interventions: Breast feeding basics reviewed;Assisted with latch;Skin to skin;Breast massage;Hand express;Education;LC Services brochure  Discharge Discharge Education: Outpatient recommendation Pump: Referral sent for [facility] Pump [facility] Program: Yes  Consult Status Consult Status: Follow-up Date: 06/07/24 Follow-up type: In-patient    [name] G 06/06/2024, 5:36 AM

## 2024-06-06 NOTE — Lactation Note (Signed)
 This note was copied from a baby's chart. Lactation Consultation Note  Patient Name: Amanda Vincent Unijb'd Date: 06/06/2024 Age:24 hours  LC attempted to see mom. Mom sleeping. Dad in chair awake. LC waved at him.   Maternal Data    Feeding    LATCH Score                    Lactation Tools Discussed/Used    Interventions    Discharge    Consult Status      Milford Cilento G 06/06/2024, 3:14 AM

## 2024-06-07 ENCOUNTER — Other Ambulatory Visit (HOSPITAL_COMMUNITY): Payer: Self-pay

## 2024-06-07 MED ORDER — PRENATAL GUMMIES/DHA & FA 0.4-32.5 MG PO CHEW: 1.0000 | CHEWABLE_TABLET | Freq: Every day | ORAL | 2 refills | Status: AC

## 2024-06-07 MED ORDER — OXYCODONE HCL 5 MG PO TABS: 5.0000 mg | ORAL_TABLET | Freq: Four times a day (QID) | ORAL | 0 refills | Status: AC | PRN

## 2024-06-07 MED ORDER — IBUPROFEN 600 MG PO TABS
600.0000 mg | ORAL_TABLET | Freq: Four times a day (QID) | ORAL | 0 refills | Status: AC
  Filled 2024-06-07: qty 30, 8d supply, fill #0

## 2024-06-07 MED ORDER — IBUPROFEN 600 MG PO TABS: 600.0000 mg | ORAL_TABLET | Freq: Four times a day (QID) | ORAL | 0 refills | Status: AC

## 2024-06-07 MED ORDER — PRENATAL GUMMIES/DHA & FA 0.4-32.5 MG PO CHEW
1.0000 | CHEWABLE_TABLET | Freq: Every day | ORAL | 2 refills | Status: DC
  Filled 2024-06-07: qty 40, fill #0

## 2024-06-07 MED ORDER — FERROUS SULFATE 325 (65 FE) MG PO TABS: 325.0000 mg | ORAL_TABLET | ORAL | 1 refills | Status: AC

## 2024-06-07 MED ORDER — OXYCODONE HCL 5 MG PO TABS
5.0000 mg | ORAL_TABLET | ORAL | 0 refills | Status: AC | PRN
  Filled 2024-06-07: qty 30, 3d supply, fill #0

## 2024-06-07 MED ORDER — ACETAMINOPHEN 500 MG PO TABS: 1000.0000 mg | ORAL_TABLET | Freq: Four times a day (QID) | ORAL | 0 refills | Status: AC | PRN

## 2024-06-07 MED ORDER — ACETAMINOPHEN 500 MG PO TABS
1000.0000 mg | ORAL_TABLET | Freq: Four times a day (QID) | ORAL | 0 refills | Status: AC | PRN
  Filled 2024-06-07: qty 30, 4d supply, fill #0

## 2024-06-07 MED ORDER — FERROUS SULFATE 325 (65 FE) MG PO TABS
325.0000 mg | ORAL_TABLET | ORAL | 1 refills | Status: AC
  Filled 2024-06-07: qty 30, 60d supply, fill #0

## 2024-06-07 NOTE — Discharge Summary (Signed)
 Postpartum Discharge Summary  Date of Service updated 06-07-24     Patient Name: Amanda Vincent DOB: 2000-10-12 MRN: [id number]  Date of admission: 06/04/2024 Delivery date:06/05/2024 Delivering provider: HENRY SLOUGH Date of discharge: 06/07/2024  Admitting diagnosis: IUGR (intrauterine growth restriction) affecting care of mother [O36.5990] Intrauterine pregnancy: [redacted]w[redacted]d     Secondary diagnosis:  Principal Problem:   IUGR (intrauterine growth restriction) affecting care of mother Active Problems:   Encounter for induction of labor   S/P cesarean section   IDA (iron deficiency anemia) PPH  Additional problems: PPH introp transfused 2u PRBCs    Discharge diagnosis: Term Pregnancy Delivered                                              Post partum procedures:blood transfusion Augmentation: Pitocin  Complications: Hemorrhage>1047mL  Hospital course: Admitted for [facility] w/SI Pre-E w/SF s/p Mg x 2 with increased BP medication to control HTN.  Asymptomatic at discharge and discharged home on labetalol 400mg  TID and procardia xl 30mg  with f/u at [facility] this week in the office.  Magnesium Sulfate received: Yes: seizure prophylaxis   There is no immunization history on file for this patient.  Physical exam  Vitals:   06/06/24 1053 06/06/24 1507 06/06/24 2328 06/07/24 0544  BP: 117/84 132/88 109/68 115/80  Pulse: 91 92 82 70  Resp:  16 16   Temp: 98 F (36.7 C) 98.2 F (36.8 C) 98.6 F (37 C) 99 F (37.2 C)  TempSrc: Axillary Oral Oral   SpO2: 100% 100% 100% 100%  Weight:      Height:       General: alert, cooperative, and no distress Lochia: appropriate Uterine Fundus: FF, NT Incision: dressing c/d/i DVT Evaluation: No evidence of DVT seen on physical exam. No cords or calf tenderness. Labs: Lab Results  Component Value Date   WBC 20.7 (H) 06/06/2024   HGB 10.7 (L) 06/06/2024   HCT 29.9 (L) 06/06/2024   MCV 74.9 (L) 06/06/2024   PLT 145 (L) 06/06/2024       Latest Ref Rng & Units 06/06/2024    5:36 AM  CMP  Glucose 70 - 99 mg/dL 80   BUN 6 - 20 mg/dL <5   Creatinine 9.55 - 1.00 mg/dL 9.18   Sodium 864 - 854 mmol/L 132   Potassium 3.5 - 5.1 mmol/L 4.4   Chloride 98 - 111 mmol/L 101   CO2 22 - 32 mmol/L 22   Calcium  8.9 - 10.3 mg/dL 8.3   Total Protein 6.5 - 8.1 g/dL 5.9   Total Bilirubin 0.0 - 1.2 mg/dL 0.8   Alkaline Phos 38 - 126 U/L 139   AST 15 - 41 U/L 33   ALT 0 - 44 U/L 12    Edinburgh Score:    06/06/2024    2:35 PM  Edinburgh Postnatal Depression Scale Screening Tool  I have been able to laugh and see the funny side of things. 0  I have looked forward with enjoyment to things. 0  I have blamed myself unnecessarily when things went wrong. 1  I have been anxious or worried for no good reason. 0  I have felt scared or panicky for no good reason. 0  Things have been getting on top of me. 1  I have been so unhappy that I have had difficulty sleeping. 0  I have felt sad or miserable. 0  I have been so unhappy that I have been crying. 0  The thought of harming myself has occurred to me. 0  Edinburgh Postnatal Depression Scale Total 2      After visit meds:  Allergies as of 06/07/2024       Reactions   Corn-containing Products    Dog Epithelium    Mold Extract [trichophyton]    Peanut-containing Drug Products    Sesame Seed (diagnostic)         Medication List     STOP taking these medications    adapalene  0.1 % cream Commonly known as: DIFFERIN    Clindamycin -Benzoyl Per (Refr) gel   prenatal multivitamin Tabs tablet       TAKE these medications    acetaminophen  500 MG tablet Commonly known as: TYLENOL  Take 2 tablets (1,000 mg total) by mouth every 6 (six) hours as needed.   acyclovir 400 MG tablet Commonly known as: ZOVIRAX Take 400 mg by mouth 5 (five) times daily.   albuterol  108 (90 Base) MCG/ACT inhaler Commonly known as: VENTOLIN  HFA Inhale 1-2 puffs into the lungs every 4 (four)  hours as needed for wheezing or shortness of breath.   EPINEPHrine  0.3 mg/0.3 mL Soaj injection Commonly known as: EpiPen  2-Pak Inject 0.3 mg into the muscle as needed for anaphylaxis.   ferrous sulfate  325 (65 FE) MG tablet Take 1 tablet (325 mg total) by mouth every other day.   ibuprofen  600 MG tablet Commonly known as: ADVIL  Take 1 tablet (600 mg total) by mouth every 6 (six) hours.   loratadine 10 MG tablet Commonly known as: CLARITIN Take 10 mg by mouth 2 (two) times daily.   oxyCODONE  5 MG immediate release tablet Commonly known as: Oxy IR/ROXICODONE  Take 1-2 tablets (5-10 mg total) by mouth every 6 (six) hours as needed for moderate pain (pain score 4-6), severe pain (pain score 7-10) or breakthrough pain.   Prenatal Gummies/DHA & FA 0.4-32.5 MG Chew Chew 1 tablet by mouth daily.         Discharge home in stable condition Infant Feeding: unsure Infant Disposition:home with mother Discharge instruction: per After Visit Summary and Postpartum booklet. Activity: Advance as tolerated. Pelvic rest for 6 weeks.  Diet: routine diet Anticipated Birth Control: Unsure Postpartum Appointment:6 weeks Additional Postpartum F/U: 1wk and 3wks for f/u d/t increased risk of infection.  Sutures placed on posterior wall of uterus. Future Appointments:No future appointments. Follow up Visit:  Follow-up Information     [facility] Obstetrics & Gynecology Follow up.   Specialty: Obstetrics and Gynecology Why: 1 week and 3 weeks incision chaeck, we will make the appointment, with DR Henry. Contact information: [address] [place]  [address] [phone]        [facility] Obstetrics & Gynecology. Schedule an appointment as soon as possible for a visit in 6 week(s).   Specialty: Obstetrics and Gynecology Why: For Postpartum follow-up Contact information: [address] [place]   [address] [phone]                    06/07/2024 Jon CINDERELLA Henry, MD

## 2024-06-08 ENCOUNTER — Encounter (HOSPITAL_COMMUNITY): Payer: Self-pay | Admitting: Obstetrics and Gynecology

## 2024-06-08 ENCOUNTER — Other Ambulatory Visit (HOSPITAL_COMMUNITY): Payer: Self-pay

## 2024-06-08 LAB — BPAM RBC
Blood Product Expiration Date: 202511072359
Blood Product Expiration Date: 202511072359
Blood Product Expiration Date: 202511072359
Blood Product Expiration Date: 202511072359
ISSUE DATE / TIME: 202510161551
ISSUE DATE / TIME: 202510161551
Unit Type and Rh: 5100
Unit Type and Rh: 5100
Unit Type and Rh: 5100
Unit Type and Rh: 5100

## 2024-06-08 LAB — TYPE AND SCREEN
ABO/RH(D): O POS
Antibody Screen: NEGATIVE
Unit division: 0
Unit division: 0
Unit division: 0
Unit division: 0

## 2024-06-09 ENCOUNTER — Other Ambulatory Visit (HOSPITAL_COMMUNITY): Payer: Self-pay

## 2024-06-09 LAB — SURGICAL PATHOLOGY

## 2024-06-17 ENCOUNTER — Telehealth (HOSPITAL_COMMUNITY): Payer: Self-pay

## 2024-06-17 NOTE — Telephone Encounter (Signed)
 06/17/2024 1941  Name: Clodagh Odenthal MRN: [id number] DOB: [birth year]  Reason for Call:  Transition of Care Hospital Discharge Call  Contact Status: Patient Contact Status: Message  Language assistant needed:          Follow-Up Questions:    Van Postnatal Depression Scale:  In the Past 7 Days:    PHQ2-9 Depression Scale:     Discharge Follow-up:    Post-discharge interventions: NA  Signature  Rosaline Deretha PEAK

## 2024-07-23 DIAGNOSIS — Z124 Encounter for screening for malignant neoplasm of cervix: Secondary | ICD-10-CM | POA: Diagnosis not present

## 2024-07-23 DIAGNOSIS — O99019 Anemia complicating pregnancy, unspecified trimester: Secondary | ICD-10-CM | POA: Diagnosis not present

## 2024-08-18 ENCOUNTER — Encounter
